# Patient Record
Sex: Male | Born: 1990
Health system: Southern US, Community
[De-identification: ages and names within clinical notes are randomized; demographics above are authoritative.]

## PROBLEM LIST (undated history)

## (undated) DIAGNOSIS — Z8719 Personal history of other diseases of the digestive system: Secondary | ICD-10-CM

## (undated) DIAGNOSIS — E7800 Pure hypercholesterolemia, unspecified: Secondary | ICD-10-CM

## (undated) HISTORY — PX: HERNIA REPAIR: SHX51

## (undated) HISTORY — DX: Personal history of other diseases of the digestive system: Z87.19

---

## 2011-11-02 ENCOUNTER — Encounter (HOSPITAL_COMMUNITY): Payer: Self-pay | Admitting: Emergency Medicine

## 2011-11-02 ENCOUNTER — Emergency Department (HOSPITAL_COMMUNITY): Payer: Worker's Compensation

## 2011-11-02 ENCOUNTER — Emergency Department (HOSPITAL_COMMUNITY)
Admission: EM | Admit: 2011-11-02 | Discharge: 2011-11-02 | Disposition: A | Discharge status: Home / Self Care | Payer: Worker's Compensation | Attending: Emergency Medicine | Admitting: Emergency Medicine

## 2011-11-02 DIAGNOSIS — S6390XA Sprain of unspecified part of unspecified wrist and hand, initial encounter: Secondary | ICD-10-CM | POA: Insufficient documentation

## 2011-11-02 DIAGNOSIS — R209 Unspecified disturbances of skin sensation: Secondary | ICD-10-CM | POA: Insufficient documentation

## 2011-11-02 DIAGNOSIS — IMO0002 Reserved for concepts with insufficient information to code with codable children: Secondary | ICD-10-CM | POA: Insufficient documentation

## 2011-11-02 DIAGNOSIS — S5000XA Contusion of unspecified elbow, initial encounter: Secondary | ICD-10-CM | POA: Insufficient documentation

## 2011-11-02 DIAGNOSIS — M7989 Other specified soft tissue disorders: Secondary | ICD-10-CM | POA: Insufficient documentation

## 2011-11-02 DIAGNOSIS — Y990 Civilian activity done for income or pay: Secondary | ICD-10-CM | POA: Insufficient documentation

## 2011-11-02 DIAGNOSIS — M25569 Pain in unspecified knee: Secondary | ICD-10-CM | POA: Insufficient documentation

## 2011-11-02 DIAGNOSIS — S63619A Unspecified sprain of unspecified finger, initial encounter: Secondary | ICD-10-CM

## 2011-11-02 DIAGNOSIS — M25529 Pain in unspecified elbow: Secondary | ICD-10-CM | POA: Insufficient documentation

## 2011-11-02 DIAGNOSIS — Y9289 Other specified places as the place of occurrence of the external cause: Secondary | ICD-10-CM | POA: Insufficient documentation

## 2011-11-02 MED ORDER — IBUPROFEN 800 MG PO TABS
800.0000 mg | ORAL_TABLET | Freq: Once | ORAL | Status: AC
  Administered 2011-11-02: 800 mg via ORAL
  Filled 2011-11-02: qty 1

## 2011-11-02 MED ORDER — HYDROCODONE-ACETAMINOPHEN 5-325 MG PO TABS
1.0000 | ORAL_TABLET | ORAL | Status: AC | PRN
Start: 2011-11-02 — End: 2011-11-12
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
## 2011-11-02 NOTE — Discharge Instructions (Signed)
Your x-rays today did not show any broken bones, dislocations or other concerning injuries to your hand or elbow. At this time your providers feel you have sprains of your fingers and hand. Breasts your hand and elbow. Use ice to reduce swelling. Take ibuprofen or Aleve for inflammation and pain during the day. Use the prescribed medications for severe pain. Do not drive or operate machinery while taking her prescribed pain medications. Please followup with the primary care provider or orthopedic specialist.   Finger Sprain A finger sprain is a tear in one of the strong, fibrous tissues that connect the bones (ligaments) in your finger. The severity of the sprain depends on how much of the ligament is torn. The tear can be either partial or complete. CAUSES  Often, sprains are a result of a fall or accident. If you extend your hands to catch an object or to protect yourself, the force of the impact causes the fibers of your ligament to stretch too much. This excess tension causes the fibers of your ligament to tear. SYMPTOMS  You may have some loss of motion in your finger. Other symptoms include:  Bruising.   Tenderness.   Swelling.  DIAGNOSIS  In order to diagnose finger sprain, your caregiver will physically examine your finger or thumb to determine how torn the ligament is. Your caregiver may also suggest an X-ray exam of your finger to make sure no bones are broken. TREATMENT  If your ligament is only partially torn, treatment usually involves keeping the finger in a fixed position (immobilization) for a short period. To do this, your caregiver will apply a bandage, cast, or splint to keep your finger from moving until it heals. For a partially torn ligament, the healing process usually takes 2 to 3 weeks. If your ligament is completely torn, you may need surgery to reconnect the ligament to the bone. After surgery a cast or splint will be applied and will need to stay on your finger or thumb  for 4 to 6 weeks while your ligament heals. HOME CARE INSTRUCTIONS  Keep your injured finger elevated, when possible, to decrease swelling.   To ease pain and swelling, apply ice to your joint twice a day, for 2 to 3 days:   Put ice in a plastic bag.   Place a towel between your skin and the bag.   Leave the ice on for 15 minutes.   Only take over-the-counter or prescription medicine for pain as directed by your caregiver.   Do not wear rings on your injured finger.   Do not leave your finger unprotected until pain and stiffness go away (usually 3 to 4 weeks).   Do not allow your cast or splint to get wet. Cover your cast or splint with a plastic bag when you shower or bathe. Do not swim.   Your caregiver may suggest special exercises for you to do during your recovery to prevent or limit permanent stiffness.  SEEK IMMEDIATE MEDICAL CARE IF:  Your cast or splint becomes damaged.   Your pain becomes worse rather than better.  MAKE SURE YOU:  Understand these instructions.   Will watch your condition.   Will get help right away if you are not doing well or get worse.  Document Released: 08/02/2004 Document Revised: 06/14/2011 Document Reviewed: 02/26/2011 [facility] Patient Information 2012 [place], [place].   Elbow Contusion Contusions are areas of tenderness and swelling in the soft tissues. They are the result of damage and bleeding in  the injured area. Minor injuries will give you a painless bruise, but more severe contusions may stay painful and swollen for a few weeks. You have a deep bruise (contusion) of your elbow. SYMPTOMS  A hematoma may form in large contusions. A hematoma is a collection of blood in the deep tissues. Hematomas are usually reabsorbed by the body naturally. Sometimes they need to be drained.  TREATMENT  Only a sling or splint may be needed for a brief period of time or as directed by your caregiver.  HOME CARE INSTRUCTIONS   Only take  over-the-counter or prescription medicines for pain, discomfort, or fever as directed by your caregiver.   Rest the injured elbow until the pain and swelling are better.   Elevate the injury to reduce swelling.   Compression bandages also help reduce swelling and motion.   If you have a splint held on with an elastic wrap or a cast, watch your hand or fingers. If they become numb or cold and blue, loosen the wrap and reapply more loosely. See your caregiver if there is no relief.   You may use ice on your elbow for 20 minutes, 4 times per day, for the first 2 to 3 days.   Use your elbow as directed.   See your caregiver as directed. It is very important to keep all follow-up referrals and appointments in order to avoid any long-term problems with your elbow including chronic pain or inability to move the elbow normally.  SEEK IMMEDIATE MEDICAL CARE IF:   You show signs of infection (increased redness, swelling, or pain).   Swelling or increased pain in your elbow is not relieved with medications.   You have pain or inability to move your fingers or wrist.   You begin to lose feeling in your hand or fingers, or you develop swelling of the hand and fingers.   You get a cold or blue hand or fingers on the injured side.   If your elbow remains sore, your caregiver may want to X-ray it again. A hairline fracture may not show up on the first X-rays and may only be seen on X-rays 10 days to 2 weeks later. A specialist (radiologist) may examine your X-rays at a later time. In order to get results from the X-rays, make sure you know how and when you are to get that information. It is your responsibility to get results of any tests you may have had.   Make sure you have a follow up exam with your caregiver.  MAKE SURE YOU:   Understand these instructions.   Will watch your condition.   Will get help right away if you are not doing well or get worse.  Document Released: 06/03/2006 Document  Revised: 06/14/2011 Document Reviewed: 05/11/2011 [facility] Patient Information 2012 [place], [place].   RESOURCE GUIDE  Dental Problems  Patients with Medicaid: [facility] [phone] [address]                                           [phone] [address] Phone:  [phone]  Phone:  [phone]  If unable to pay or uninsured, contact:  [facility] or [facility]. to become qualified for the adult dental clinic.  Chronic Pain Problems Contact [facility]  [phone] Patients need to be referred by their primary care doctor.  Insufficient Money for Medicine Contact [employer]:  call "211" or [facility] 424-639-8770.  No Primary Care Doctor Call [facility]  [phone] Other agencies that provide inexpensive medical care    [facility]  [phone]    [facility] Internal Medicine  [phone]    [facility]  [phone]    [facility]  [phone]    [facility]  [phone]    [facility] Child Clinic  [phone]  Psychological Services [facility] Behavioral Health  [phone] [facility]  [phone] [facility] Mental Health   [phone] (emergency services [phone])  Substance Abuse Resources [facility]  971-166-4764 [facility] 812-571-8818 [facility] [phone] Floydene Flock 623 622 6228 Residential & Outpatient Substance Abuse Program  [phone]  Abuse/Neglect [facility] Child Abuse Hotline (410)494-3740 [facility] Child Abuse Hotline 475 107 5406 (After Hours)  Emergency Shelter [facility] [phone]  Maternity Homes [facility] [place] [facility] (662)380-1395 [facility] 5305367340  MRSA Hotline #:   734-153-9039    [facility] Resources  [facility] [place]     [employer]                           [facility] Dept. [address] [place]                       [address]      [address]  Blondell Reveal Phone:  [phone]                                   Phone:  [phone]                 Phone:  [phone]  [facility] Mental Health Phone:  [phone]  [facility] Child Abuse Hotline 979-107-5428 5801827977 (After Hours)

## 2011-11-02 NOTE — ED Provider Notes (Signed)
Medical screening examination/treatment/procedure(s) were performed by non-physician practitioner and as supervising physician I was immediately available for consultation/collaboration.   Sunnie Nielsen, MD 11/02/11 [phone]
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
## 2011-11-02 NOTE — ED Notes (Signed)
The pt is requesting finger splints to help keep him from striking them on objects

## 2013-06-01 ENCOUNTER — Encounter (HOSPITAL_COMMUNITY): Payer: Self-pay | Admitting: Emergency Medicine

## 2013-06-01 ENCOUNTER — Emergency Department (HOSPITAL_COMMUNITY)
Admission: EM | Admit: 2013-06-01 | Discharge: 2013-06-01 | Disposition: A | Discharge status: Home / Self Care | Payer: 59 | Attending: Emergency Medicine | Admitting: Emergency Medicine

## 2013-06-01 DIAGNOSIS — M65839 Other synovitis and tenosynovitis, unspecified forearm: Secondary | ICD-10-CM | POA: Insufficient documentation

## 2013-06-01 DIAGNOSIS — F17200 Nicotine dependence, unspecified, uncomplicated: Secondary | ICD-10-CM | POA: Insufficient documentation

## 2013-06-01 DIAGNOSIS — M778 Other enthesopathies, not elsewhere classified: Secondary | ICD-10-CM

## 2013-06-01 DIAGNOSIS — R110 Nausea: Secondary | ICD-10-CM | POA: Insufficient documentation

## 2013-06-01 DIAGNOSIS — Z7982 Long term (current) use of aspirin: Secondary | ICD-10-CM | POA: Insufficient documentation

## 2013-06-01 MED ORDER — HYDROCODONE-ACETAMINOPHEN 5-325 MG PO TABS: 1.0000 | ORAL_TABLET | ORAL | Status: DC | PRN

## 2013-06-01 MED ORDER — TRAMADOL HCL 50 MG PO TABS: 50.0000 mg | ORAL_TABLET | Freq: Four times a day (QID) | ORAL | Status: DC | PRN

## 2013-06-01 MED ORDER — IBUPROFEN 800 MG PO TABS: 800.0000 mg | ORAL_TABLET | Freq: Three times a day (TID) | ORAL | Status: DC | PRN

## 2013-06-01 NOTE — ED Notes (Signed)
Pt states for couple of days has noticed pain and swelling to right wrist without notable injury.  Pt has pain with trying to extend fingers

## 2013-06-01 NOTE — Progress Notes (Signed)
Orthopedic Tech Progress Note Patient Details:  Henry Henry June 06, 1991 [id number]  Ortho Devices Type of Ortho Device: Thumb velcro splint Ortho Device/Splint Location: RUE Ortho Device/Splint Interventions: Ordered;Application   Jennye Moccasin 06/01/2013, 6:41 PM

## 2013-06-01 NOTE — ED Provider Notes (Signed)
CSN: [id number]     Arrival date & time 06/01/13  1657 History   This chart was scribed for Henry Dredge, PA-C, working with Shon Baton, MD by Blanchard Kelch, ED Scribe. This patient was seen in room TR06C/TR06C and the patient's care was started at 6:05 PM.    Chief Complaint  Patient presents with  . Wrist Pain    Patient is a 22 y.o. male presenting with wrist pain. The history is provided by the patient. No language interpreter was used.  Wrist Pain  Wrist Pain Associated symptoms include arthralgias, joint swelling and nausea. Pertinent negatives include no chills, congestion, coughing, fever, myalgias, numbness or weakness.    HPI Comments: Henry Henry is a 22 y.o. male who presents to the Emergency Department complaining of constant pain and associated swelling to his right hand that began three days ago. The pain is worsened with flexion of his right fingers. He describes the feeling as "grinding." He has iced the area and taken ibuprofen without relief of swelling or pain. He placed a brace on the wrist with mild relief of symptoms. He also had associated nausea today secondary to pain. He denies numbness or tingling, weakness, fever, chills, myalgias, cough, congestion, penile pain or discharge, STD treatment, or pain in any other joints. He denies a family or past medical history of gout. He is right handed. He just started a new job at [employer] a week ago, but denies any repetitive use of his wrist or hand at his job, including typing. He denies any recent injury or trauma to the area.    History reviewed. No pertinent past medical history. History reviewed. No pertinent past surgical history. No family history on file. History  Substance Use Topics  . Smoking status: Current Every Day Smoker    Last Attempt to Quit: 10/04/2011  . Smokeless tobacco: Not on file  . Alcohol Use: No    Review of Systems  Constitutional: Negative for fever and chills.  HENT: Negative  for congestion.   Respiratory: Negative for cough.   Gastrointestinal: Positive for nausea.  Genitourinary: Negative for discharge and penile pain.  Musculoskeletal: Positive for arthralgias and joint swelling. Negative for myalgias.  Neurological: Negative for weakness and numbness.    Allergies  Review of patient's allergies indicates no known allergies.  Home Medications   Current Outpatient Rx  Name  Route  Sig  Dispense  Refill  . aspirin (ASPIRIN EC) 81 MG EC tablet   Oral   Take 81 mg by mouth once. Swallow whole.          Triage Vitals: BP 128/88  Pulse 54  Temp(Src) 97.6 F (36.4 C) (Oral)  Resp 18  Wt 214 lb 9 oz (97.325 kg)  SpO2 100%  Physical Exam  Nursing note and vitals reviewed. Constitutional: He appears well-developed and well-nourished. No distress.  HENT:  Head: Normocephalic and atraumatic.  Neck: Neck supple.  Pulmonary/Chest: Effort normal.  Musculoskeletal:       Right wrist: He exhibits bony tenderness and swelling. He exhibits normal range of motion, no crepitus, no deformity and no laceration.       Right forearm: Normal.       Right hand: Normal.  Tenderness over radio-dorsal and radial aspect of right wrist.  Pain greatly increased with Lourena Simmonds test and with flexion of all fingers.  Capillary refill < 3 seconds, sensation intact, radial pulse is intact.   Neurological: He is alert.  Skin: He is not  diaphoretic.    ED Course  Procedures (including critical care time)  DIAGNOSTIC STUDIES: Oxygen Saturation is 100% on room air, normal by my interpretation.    COORDINATION OF CARE: 6:10 PM -Will order wrist brace and recommend specialist to follow up with. Patient verbalizes understanding and agrees with treatment plan.    Labs Review Labs Reviewed - No data to display Imaging Review No results found.  EKG Interpretation   None       MDM   1. Tendonitis of wrist, right    Pt with one week of pain without injury to  right wrist, worse with use of flexor tendons and with Lourena Simmonds test de quervain's tenosynovitis vs flexor tendonitis.  Pt placed in thumb spice velcro splint, d/c home with RICE instructions and NSAIDs, pain medication. Hand surgery follow up.  Discussed  findings, treatment, and follow up  with patient.  Pt given return precautions.  Pt verbalizes understanding and agrees with plan.      I doubt any other EMC precluding discharge at this time including, but not necessarily limited to the following: septic joint, fracture  I personally performed the services described in this documentation, which was scribed in my presence. The recorded information has been reviewed and is accurate.    Henry Dredge, PA-C 06/01/13 2203

## 2013-06-02 NOTE — ED Provider Notes (Signed)
Medical screening examination/treatment/procedure(s) were performed by non-physician practitioner and as supervising physician I was immediately available for consultation/collaboration.  EKG Interpretation   None        Donnalynn Wheeless F Arshia Rondon, MD 06/02/13 1038 

## 2013-09-10 ENCOUNTER — Ambulatory Visit
Admission: RE | Admit: 2013-09-10 | Discharge: 2013-09-10 | Disposition: A | Discharge status: Home / Self Care | Payer: 59 | Source: Ambulatory Visit | Attending: Family Medicine | Admitting: Family Medicine

## 2013-09-10 ENCOUNTER — Other Ambulatory Visit: Payer: Self-pay | Admitting: Family Medicine

## 2013-09-10 DIAGNOSIS — M25572 Pain in left ankle and joints of left foot: Secondary | ICD-10-CM

## 2015-06-07 ENCOUNTER — Encounter (HOSPITAL_COMMUNITY): Payer: Self-pay | Admitting: Emergency Medicine

## 2015-06-07 ENCOUNTER — Emergency Department (HOSPITAL_COMMUNITY): Payer: 59

## 2015-06-07 ENCOUNTER — Emergency Department (HOSPITAL_COMMUNITY)
Admission: EM | Admit: 2015-06-07 | Discharge: 2015-06-07 | Disposition: A | Discharge status: Home / Self Care | Payer: 59 | Attending: Emergency Medicine | Admitting: Emergency Medicine

## 2015-06-07 DIAGNOSIS — K30 Functional dyspepsia: Secondary | ICD-10-CM | POA: Diagnosis not present

## 2015-06-07 DIAGNOSIS — M542 Cervicalgia: Secondary | ICD-10-CM | POA: Diagnosis not present

## 2015-06-07 DIAGNOSIS — F17200 Nicotine dependence, unspecified, uncomplicated: Secondary | ICD-10-CM | POA: Diagnosis not present

## 2015-06-07 DIAGNOSIS — R079 Chest pain, unspecified: Secondary | ICD-10-CM | POA: Diagnosis not present

## 2015-06-07 DIAGNOSIS — Z8639 Personal history of other endocrine, nutritional and metabolic disease: Secondary | ICD-10-CM | POA: Diagnosis not present

## 2015-06-07 HISTORY — DX: Pure hypercholesterolemia, unspecified: E78.00

## 2015-06-07 LAB — CBC
HEMATOCRIT: 45.6 % (ref 39.0–52.0)
HEMOGLOBIN: 15.7 g/dL (ref 13.0–17.0)
MCH: 28.6 pg (ref 26.0–34.0)
MCHC: 34.4 g/dL (ref 30.0–36.0)
MCV: 83.2 fL (ref 78.0–100.0)
Platelets: 258 10*3/uL (ref 150–400)
RBC: 5.48 MIL/uL (ref 4.22–5.81)
RDW: 13.1 % (ref 11.5–15.5)
WBC: 9.8 10*3/uL (ref 4.0–10.5)

## 2015-06-07 LAB — BASIC METABOLIC PANEL
ANION GAP: 8 (ref 5–15)
BUN: 12 mg/dL (ref 6–20)
CHLORIDE: 105 mmol/L (ref 101–111)
CO2: 28 mmol/L (ref 22–32)
Calcium: 9.9 mg/dL (ref 8.9–10.3)
Creatinine, Ser: 1.11 mg/dL (ref 0.61–1.24)
GFR calc Af Amer: >60 mL/min (ref >60)
GFR calc non Af Amer: >60 mL/min (ref >60)
Glucose, Bld: 99 mg/dL (ref 65–99)
POTASSIUM: 3.9 mmol/L (ref 3.5–5.1)
SODIUM: 141 mmol/L (ref 135–145)

## 2015-06-07 LAB — I-STAT TROPONIN, ED: Troponin i, poc: 0 ng/mL (ref 0.00–0.08)

## 2015-06-07 MED ORDER — RANITIDINE HCL 150 MG PO TABS: 150.0000 mg | ORAL_TABLET | Freq: Two times a day (BID) | ORAL | Status: DC

## 2015-06-07 MED ORDER — PANTOPRAZOLE SODIUM 20 MG PO TBEC: 20.0000 mg | DELAYED_RELEASE_TABLET | Freq: Every day | ORAL | Status: DC

## 2015-06-07 NOTE — ED Provider Notes (Signed)
CSN: [id number]     Arrival date & time 06/07/15  1621 History   First MD Initiated Contact with Patient 06/07/15 2226     Chief Complaint  Patient presents with  . Chest Pain  . Neck Pain    Patient is a 24 y.o. male presenting with chest pain. The history is provided by the patient.  Chest Pain Pain location:  Substernal area Pain quality: burning   Radiates to: mid chest. Pain severity:  Moderate Onset quality:  Gradual Duration:  1 week Timing:  Intermittent Progression:  Waxing and waning Chronicity:  New Context: eating   Relieved by:  Nothing Associated symptoms: no abdominal pain, no back pain, no cough, no fever, no headache, no nausea, no shortness of breath and not vomiting   Associated symptoms comment:  Neck pain Risk factors: no coronary artery disease     Past Medical History  Diagnosis Date  . Hypercholesteremia    History reviewed. No pertinent past surgical history. History reviewed. No pertinent family history. Social History  Substance Use Topics  . Smoking status: Current Every Day Smoker    Last Attempt to Quit: 10/04/2011  . Smokeless tobacco: None  . Alcohol Use: No    Review of Systems  Constitutional: Negative for fever and chills.  HENT: Negative for rhinorrhea and sore throat.   Eyes: Negative for visual disturbance.  Respiratory: Negative for cough and shortness of breath.   Cardiovascular: Positive for chest pain.  Gastrointestinal: Negative for nausea, vomiting, abdominal pain, diarrhea and constipation.  Genitourinary: Negative for dysuria and hematuria.  Musculoskeletal: Positive for neck pain. Negative for back pain.  Skin: Negative for rash.  Neurological: Negative for syncope and headaches.  Psychiatric/Behavioral: Negative for confusion.  All other systems reviewed and are negative.  Allergies  Review of patient's allergies indicates no known allergies.  Home Medications   Prior to Admission medications   Medication Sig  Start Date End Date Taking? Authorizing Provider  pantoprazole (PROTONIX) 20 MG tablet Take 1 tablet (20 mg total) by mouth daily. 06/07/15   Maris Berger, MD  ranitidine (ZANTAC) 150 MG tablet Take 1 tablet (150 mg total) by mouth 2 (two) times daily. 06/07/15   Maris Berger, MD   BP 116/74 mmHg  Pulse 93  Temp(Src) 98.9 F (37.2 C) (Oral)  Resp 18  SpO2 98% Physical Exam  Constitutional: He is oriented to person, place, and time. He appears well-developed and well-nourished. No distress.  HENT:  Head: Normocephalic and atraumatic.  Mouth/Throat: Oropharynx is clear and moist. No oropharyngeal exudate, posterior oropharyngeal edema or posterior oropharyngeal erythema.  Eyes: EOM are normal.  Neck: Full passive range of motion without pain. Neck supple. No JVD present. Muscular tenderness (left SCM near sternal end) present. No spinous process tenderness present. Carotid bruit is not present. No rigidity. Normal range of motion present. No thyromegaly present.  Cardiovascular: Normal rate, regular rhythm, normal heart sounds and intact distal pulses.   Pulmonary/Chest: Effort normal and breath sounds normal.  Abdominal: Soft. He exhibits no distension. There is tenderness (mild) in the epigastric area.  Musculoskeletal: Normal range of motion. He exhibits no edema.  Lymphadenopathy:    He has no cervical adenopathy.  Neurological: He is alert and oriented to person, place, and time. No cranial nerve deficit.  Skin: Skin is warm and dry.  Psychiatric: His behavior is normal.    ED Course  Procedures  None   Labs Review Labs Reviewed  BASIC METABOLIC PANEL  CBC  Rosezena SensorI-STAT TROPOININ, ED    Imaging Review Dg Chest 2 View  06/07/2015  CLINICAL DATA:  Shortness of breath and chest pain for several days EXAM: CHEST  2 VIEW COMPARISON:  May 13, 2011 FINDINGS: The lungs are clear. The heart size and pulmonary vascularity are normal. No adenopathy. No pneumothorax. No bone lesions.  IMPRESSION: No edema or consolidation. Electronically Signed   By: Bretta BangWilliam  Woodruff III M.D.   On: 06/07/2015 17:11   I have personally reviewed and evaluated these images and lab results as part of my medical decision-making.  MDM   Final diagnoses:  Indigestion  Neck pain on left side   24 yo M with a PMH of hypercholesterolemia and tobacco abuse who presents with chest pain and left sided neck pain. Patient reportedly has GERD and was treated in the past, currently unmedicated. Pain is burning and travels from epigastrium to midsternum. Left neck pain is reproducible to palpation of SCM near sternal attachment. No LAD, no stridor, otherwise normal HENT exam. Suspect MSK. Recommended NSAIDs for pain. CXR unremarkable. EKG with no ST elevation or depression, NSR, rate 94, intervals maintained, normal axis, no BBB, no wellens, no brugada, no WPW. Nonspecific T wave changes in inferior leads. No prior EKG to compare. Doubt ACS or PE. Will begin trial of H2 blockers and PPI for symptoms. Smoking cessation counseling provided. I provided verbal discharge instructions including follow up and return precautions. Patient agreeable with plan.   Discussed with Dr. Denton LankSteinl.  Maris BergerJonah Tywanna Seifer, MD 06/08/15 [id number]  Cathren LaineKevin Steinl, MD 06/08/15 [phone]

## 2015-06-07 NOTE — ED Notes (Signed)
MD at bedside. 

## 2015-06-07 NOTE — ED Notes (Signed)
Pt sts neck pain x 3 days with mid sternal CP and some SOB

## 2017-11-15 ENCOUNTER — Ambulatory Visit (INDEPENDENT_AMBULATORY_CARE_PROVIDER_SITE_OTHER): Payer: Worker's Compensation | Admitting: Family Medicine

## 2017-11-15 ENCOUNTER — Encounter: Payer: Self-pay | Admitting: Family Medicine

## 2017-11-15 VITALS — BP 119/78 | HR 73 | Temp 98.4°F | Ht 68.0 in | Wt 223.2 lb

## 2017-11-15 DIAGNOSIS — S60931A Unspecified superficial injury of right thumb, initial encounter: Secondary | ICD-10-CM | POA: Diagnosis not present

## 2017-11-15 DIAGNOSIS — W5911XA Bitten by nonvenomous snake, initial encounter: Secondary | ICD-10-CM | POA: Diagnosis not present

## 2017-11-15 NOTE — Progress Notes (Signed)
   HPI  Patient presents today for possible snakebite.  Workers Comp case  Patient was cleaning up brush today around 2:00 for the city of [place] whenever he felt a bump on his right thumb and looked down and saw a snake.  A colleague used a shovel to cut the head off of the snake and he brought in today.  It appears to be a baby copperhead snake.  He states that he feels slight swelling in the right hand. He otherwise feels normal  PMH: Smoking status noted ROS: Per HPI  Objective: BP 119/78   Pulse 73   Temp 98.4 F (36.9 C) (Oral)   Ht  (1.727 m)   Wt 223 lb 3.2 oz (101.2 kg)   BMI 33.94 kg/m  Gen: NAD, alert, cooperative with exam HEENT: NCAT CV: RRR, good S1/S2, no murmur Resp: CTABL, no wheezes, non-labored Ext: No edema, warm Neuro: Alert and oriented, No gross deficits Skin:   Right thumb with extremely small lesion at the site of concern.  1 mm x 3 mm with 2 distinct punctate lesions that could be very small fang bites.       Assessment and plan:  #Snakebite Patient with possible snakebite, it is very unclear whether or not this broke the skin, however with the patient sensing hand swelling and the seriousness of copperhead  venom I think it is most prudent to go ahead and go to the emergency room for monitoring.   Murtis Sink, MD [facility] 11/15/2017, 3:16 PM

## 2018-03-14 ENCOUNTER — Encounter (HOSPITAL_COMMUNITY): Payer: Self-pay | Admitting: Emergency Medicine

## 2018-03-14 ENCOUNTER — Emergency Department (HOSPITAL_COMMUNITY): Payer: BLUE CROSS/BLUE SHIELD

## 2018-03-14 ENCOUNTER — Emergency Department (HOSPITAL_COMMUNITY)
Admission: EM | Admit: 2018-03-14 | Discharge: 2018-03-14 | Disposition: A | Discharge status: Home / Self Care | Payer: BLUE CROSS/BLUE SHIELD | Attending: Emergency Medicine | Admitting: Emergency Medicine

## 2018-03-14 DIAGNOSIS — R0602 Shortness of breath: Secondary | ICD-10-CM | POA: Insufficient documentation

## 2018-03-14 DIAGNOSIS — R61 Generalized hyperhidrosis: Secondary | ICD-10-CM | POA: Diagnosis not present

## 2018-03-14 DIAGNOSIS — R42 Dizziness and giddiness: Secondary | ICD-10-CM | POA: Diagnosis not present

## 2018-03-14 DIAGNOSIS — Z87891 Personal history of nicotine dependence: Secondary | ICD-10-CM | POA: Insufficient documentation

## 2018-03-14 DIAGNOSIS — R110 Nausea: Secondary | ICD-10-CM | POA: Insufficient documentation

## 2018-03-14 DIAGNOSIS — R0789 Other chest pain: Secondary | ICD-10-CM | POA: Diagnosis not present

## 2018-03-14 DIAGNOSIS — R079 Chest pain, unspecified: Secondary | ICD-10-CM | POA: Diagnosis not present

## 2018-03-14 LAB — COMPREHENSIVE METABOLIC PANEL
ALT: 39 U/L (ref 0–44)
AST: 33 U/L (ref 15–41)
Albumin: 4.2 g/dL (ref 3.5–5.0)
Alkaline Phosphatase: 83 U/L (ref 38–126)
Anion gap: 11 (ref 5–15)
BUN: 16 mg/dL (ref 6–20)
CHLORIDE: 110 mmol/L (ref 98–111)
CO2: 24 mmol/L (ref 22–32)
CREATININE: 1.02 mg/dL (ref 0.61–1.24)
Calcium: 10.2 mg/dL (ref 8.9–10.3)
GFR calc non Af Amer: >60 mL/min (ref >60)
Glucose, Bld: 111 mg/dL — ABNORMAL HIGH (ref 70–99)
Potassium: 3.8 mmol/L (ref 3.5–5.1)
SODIUM: 145 mmol/L (ref 135–145)
Total Bilirubin: 1 mg/dL (ref 0.3–1.2)
Total Protein: 7.8 g/dL (ref 6.5–8.1)

## 2018-03-14 LAB — D-DIMER, QUANTITATIVE (NOT AT ARMC): D DIMER QUANT: 0.34 ug{FEU}/mL (ref 0.00–0.50)

## 2018-03-14 LAB — CBC WITH DIFFERENTIAL/PLATELET
Basophils Absolute: 0 10*3/uL (ref 0.0–0.1)
Basophils Relative: 0 %
EOS ABS: 0.1 10*3/uL (ref 0.0–0.7)
Eosinophils Relative: 1 %
HCT: 43.9 % (ref 39.0–52.0)
Hemoglobin: 15.4 g/dL (ref 13.0–17.0)
LYMPHS ABS: 1.6 10*3/uL (ref 0.7–4.0)
LYMPHS PCT: 20 %
MCH: 28.7 pg (ref 26.0–34.0)
MCHC: 35.1 g/dL (ref 30.0–36.0)
MCV: 81.8 fL (ref 78.0–100.0)
Monocytes Absolute: 0.5 10*3/uL (ref 0.1–1.0)
Monocytes Relative: 7 %
NEUTROS PCT: 72 %
Neutro Abs: 5.8 10*3/uL (ref 1.7–7.7)
PLATELETS: 269 10*3/uL (ref 150–400)
RBC: 5.37 MIL/uL (ref 4.22–5.81)
RDW: 13.1 % (ref 11.5–15.5)
WBC: 8 10*3/uL (ref 4.0–10.5)

## 2018-03-14 LAB — TROPONIN I: Troponin I: <0.03 ng/mL (ref <0.03)

## 2018-03-14 LAB — LIPASE, BLOOD: Lipase: 37 U/L (ref 11–51)

## 2018-03-14 LAB — I-STAT TROPONIN, ED
TROPONIN I, POC: 0 ng/mL (ref 0.00–0.08)
Troponin i, poc: 0.02 ng/mL (ref 0.00–0.08)

## 2018-03-14 MED ORDER — SODIUM CHLORIDE 0.9 % IV BOLUS
1000.0000 mL | Freq: Once | INTRAVENOUS | Status: AC
  Administered 2018-03-14: 1000 mL via INTRAVENOUS

## 2018-03-14 MED ORDER — LORAZEPAM 2 MG/ML IJ SOLN
0.5000 mg | Freq: Once | INTRAMUSCULAR | Status: AC
  Administered 2018-03-14: 0.5 mg via INTRAVENOUS
  Filled 2018-03-14: qty 1

## 2018-03-14 MED ORDER — MECLIZINE HCL 25 MG PO TABS: 25.0000 mg | ORAL_TABLET | Freq: Three times a day (TID) | ORAL | 0 refills | Status: DC | PRN

## 2018-03-14 MED ORDER — ONDANSETRON 4 MG PO TBDP
4.0000 mg | ORAL_TABLET | Freq: Once | ORAL | Status: AC
  Administered 2018-03-14: 4 mg via ORAL
  Filled 2018-03-14: qty 1

## 2018-03-14 MED ORDER — ONDANSETRON 4 MG PO TBDP: ORAL_TABLET | ORAL | 0 refills | Status: DC

## 2018-03-14 MED ORDER — MECLIZINE HCL 25 MG PO TABS
25.0000 mg | ORAL_TABLET | Freq: Once | ORAL | Status: AC
  Administered 2018-03-14: 25 mg via ORAL
  Filled 2018-03-14: qty 1

## 2018-03-14 MED ORDER — GI COCKTAIL ~~LOC~~
30.0000 mL | Freq: Once | ORAL | Status: AC
  Administered 2018-03-14: 30 mL via ORAL
  Filled 2018-03-14: qty 30

## 2018-03-14 NOTE — ED Notes (Signed)
Patient ambulated to bathroom. Patient tolerated well, vital signs stable, and denies chest pain.

## 2018-03-14 NOTE — ED Provider Notes (Signed)
[facility]-EMERGENCY DEPT Provider Note   CSN: [id number] Arrival date & time: 03/14/18  4098     History   Chief Complaint Chief Complaint  Patient presents with  . Shortness of Breath  . Dizziness  . Chest Pain    HPI Henry Henry is a 27 y.o. male.  HPI Patient states he woke up early this morning around 2 AM with room spinning sensation, nausea and diaphoresis.  States that symptoms improved and he went back to sleep.  He then woke up later this morning with central chest pressure and shortness of breath.  Still complains of lightheadedness but no spinning sensation.  Still had mild nausea but no vomiting.  States he went to work and symptoms persisted.  Rate was in the 140s by the monitor on his watch.  EMS called and patient had a normal twelve-lead EKG.  Patient continues to have central chest tightness which does not radiate.  Also complains of shortness of breath without cough.  No new lower extremity swelling or pain.  No recent immobilization or extended travel.  Patient states he his father did have quadruple bypass in his 62s.  Denies drug or alcohol use.  No recent NSAID use.  Patient had some ringing in his right ear several days ago which has resolved. Past Medical History:  Diagnosis Date  . Hypercholesteremia     There are no active problems to display for this patient.   History reviewed. No pertinent surgical history.      Home Medications    Prior to Admission medications   Medication Sig Start Date End Date Taking? Authorizing Provider  meclizine (ANTIVERT) 25 MG tablet Take 1 tablet (25 mg total) by mouth 3 (three) times daily as needed for dizziness. 03/14/18   Loren Racer, MD  ondansetron (ZOFRAN ODT) 4 MG disintegrating tablet 4mg  ODT q4 hours prn nausea/vomit 03/14/18   Loren Racer, MD    Family History No family history on file.  Social History Social History   Tobacco Use  . Smoking status: Former Smoker   Last attempt to quit: 10/04/2011    Years since quitting: 6.4  . Smokeless tobacco: Never Used  Substance Use Topics  . Alcohol use: No  . Drug use: No     Allergies   Patient has no known allergies.   Review of Systems Review of Systems  Constitutional: Positive for diaphoresis. Negative for chills and fever.  HENT: Positive for tinnitus. Negative for congestion, rhinorrhea, sinus pressure and sinus pain.   Eyes: Negative for visual disturbance.  Respiratory: Positive for chest tightness and shortness of breath. Negative for cough.   Cardiovascular: Positive for chest pain and palpitations.  Gastrointestinal: Positive for abdominal pain and nausea. Negative for constipation, diarrhea and vomiting.  Genitourinary: Negative for dysuria, flank pain and frequency.  Musculoskeletal: Negative for back pain, joint swelling, myalgias and neck pain.  Skin: Negative for rash and wound.  Neurological: Positive for dizziness and light-headedness. Negative for syncope, weakness, numbness and headaches.  Psychiatric/Behavioral: The patient is nervous/anxious.   All other systems reviewed and are negative.    Physical Exam Updated Vital Signs BP 116/69   Pulse (!) 54   Temp 97.6 F (36.4 C) (Oral)   Resp 19   Wt 97.5 kg   SpO2 98%   BMI 32.69 kg/m   Physical Exam  Constitutional: He is oriented to person, place, and time. He appears well-developed and well-nourished. No distress.  HENT:  Head: Normocephalic  and atraumatic.  Mouth/Throat: Oropharynx is clear and moist. No oropharyngeal exudate.  Mild bulging of the right TM.  Eyes: Pupils are equal, round, and reactive to light. EOM are normal.  Fatigable horizontal nystagmus with fast component to the right  Neck: Normal range of motion. Neck supple. No JVD present.  Cardiovascular: Normal rate and regular rhythm. Exam reveals no gallop and no friction rub.  No murmur heard. Pulmonary/Chest: Effort normal and breath sounds  normal. No stridor. No respiratory distress. He has no wheezes. He has no rales. He exhibits no tenderness.  Abdominal: Soft. Bowel sounds are normal. There is tenderness. There is no rebound and no guarding.  Epigastric tenderness to palpation.  Musculoskeletal: Normal range of motion. He exhibits no edema or tenderness.  No midline thoracic or lumbar tenderness.  No CVA tenderness.  No definite lower extremity swelling, asymmetry or tenderness.  Lymphadenopathy:    He has no cervical adenopathy.  Neurological: He is alert and oriented to person, place, and time.  Patient is alert and oriented x3 with clear, goal oriented speech. Patient has 5/5 motor in all extremities. Sensation is intact to light touch. Bilateral finger-to-nose is normal with no signs of dysmetria.   Skin: Skin is warm and dry. Capillary refill takes less than 2 seconds. No rash noted. He is not diaphoretic. No erythema.  Psychiatric: He has a normal mood and affect. His behavior is normal.  Nursing note and vitals reviewed.    ED Treatments / Results  Labs (all labs ordered are listed, but only abnormal results are displayed) Labs Reviewed  COMPREHENSIVE METABOLIC PANEL - Abnormal; Notable for the following components:      Result Value   Glucose, Bld 111 (*)    All other components within normal limits  CBC WITH DIFFERENTIAL/PLATELET  TROPONIN I  D-DIMER, QUANTITATIVE (NOT AT [facility])  LIPASE, BLOOD  I-STAT TROPONIN, ED  I-STAT TROPONIN, ED    EKG EKG Interpretation  Date/Time:  Friday March 14 2018 08:50:58 EDT Ventricular Rate:  58 PR Interval:    QRS Duration: 100 QT Interval:  374 QTC Calculation: 368 R Axis:   29 Text Interpretation:  Sinus rhythm Confirmed by Loren Racer ([id number]) on 03/14/2018 9:14:01 AM   Radiology Dg Chest 2 View  Result Date: 03/14/2018 CLINICAL DATA:  Shortness of breath and chest pain EXAM: CHEST - 2 VIEW COMPARISON:  June 07, 2015 FINDINGS: Lungs are clear. Heart  is borderline enlarged with pulmonary vascularity normal. No adenopathy. No bone lesions. No pneumothorax. IMPRESSION: Borderline cardiac enlargement.  No edema or consolidation. Electronically Signed   By: Bretta Bang III M.D.   On: 03/14/2018 09:39    Procedures Procedures (including critical care time)  Medications Ordered in ED Medications  gi cocktail (Maalox,Lidocaine,Donnatal) (30 mLs Oral Given 03/14/18 0958)  meclizine (ANTIVERT) tablet 25 mg (25 mg Oral Given 03/14/18 0959)  ondansetron (ZOFRAN-ODT) disintegrating tablet 4 mg (4 mg Oral Given 03/14/18 1001)  sodium chloride 0.9 % bolus 1,000 mL (0 mLs Intravenous Stopped 03/14/18 1245)  LORazepam (ATIVAN) injection 0.5 mg (0.5 mg Intravenous Given 03/14/18 1126)  sodium chloride 0.9 % bolus 1,000 mL (1,000 mLs Intravenous New Bag/Given 03/14/18 1245)     Initial Impression / Assessment and Plan / ED Course  I have reviewed the triage vital signs and the nursing notes.  Pertinent labs & imaging results that were available during my care of the patient were reviewed by me and considered in my medical decision making (see chart  for details).     Troponin x2 is normal.  EKG without ischemic findings.  Laboratory work-up is normal including normal d-dimer.  Appears to have a vertiginous component to his dizziness as well as some lightheadedness.  Nausea and spinning sensation is resolved after meclizine.  Improved lightheadedness after IV fluids.  Patient is able to ambulate without difficulty.  Think he is medically cleared to be discharged.  Encouraged him to follow-up with cardiology if his symptoms persist.  Worsening symptoms, come back to the emergency department for reevaluation.  Final Clinical Impressions(s) / ED Diagnoses   Final diagnoses:  Dizziness  Atypical chest pain    ED Discharge Orders         Ordered    meclizine (ANTIVERT) 25 MG tablet  3 times daily PRN     03/14/18 1427    ondansetron (ZOFRAN ODT) 4 MG  disintegrating tablet     03/14/18 1427           Loren Racer, MD 03/17/18 2313

## 2018-03-14 NOTE — ED Notes (Signed)
Patient given discharge teaching and verbalized understanding. Patient ambulated out of ED with a steady gait. 

## 2018-03-14 NOTE — ED Triage Notes (Addendum)
Pt c/o shortness of breath, dizziness and chest pain since early this morning. Pt states his heart rate was in the 140's this morning, now 58-70 in triage.

## 2018-03-14 NOTE — ED Notes (Signed)
Pt ambulated to restroom. 

## 2018-07-25 DIAGNOSIS — Z3009 Encounter for other general counseling and advice on contraception: Secondary | ICD-10-CM | POA: Diagnosis not present

## 2018-07-25 DIAGNOSIS — Z720 Tobacco use: Secondary | ICD-10-CM | POA: Diagnosis not present

## 2018-09-15 DIAGNOSIS — Z3009 Encounter for other general counseling and advice on contraception: Secondary | ICD-10-CM | POA: Diagnosis not present

## 2018-10-10 DIAGNOSIS — Z302 Encounter for sterilization: Secondary | ICD-10-CM | POA: Diagnosis not present

## 2018-11-27 DIAGNOSIS — F411 Generalized anxiety disorder: Secondary | ICD-10-CM | POA: Diagnosis not present

## 2018-12-04 DIAGNOSIS — F411 Generalized anxiety disorder: Secondary | ICD-10-CM | POA: Diagnosis not present

## 2018-12-09 DIAGNOSIS — M545 Low back pain: Secondary | ICD-10-CM | POA: Diagnosis not present

## 2018-12-11 DIAGNOSIS — F411 Generalized anxiety disorder: Secondary | ICD-10-CM | POA: Diagnosis not present

## 2018-12-18 DIAGNOSIS — F411 Generalized anxiety disorder: Secondary | ICD-10-CM | POA: Diagnosis not present

## 2019-04-22 ENCOUNTER — Other Ambulatory Visit: Payer: Self-pay | Admitting: Physician Assistant

## 2019-04-22 DIAGNOSIS — R42 Dizziness and giddiness: Secondary | ICD-10-CM | POA: Diagnosis not present

## 2019-04-22 DIAGNOSIS — M7989 Other specified soft tissue disorders: Secondary | ICD-10-CM

## 2019-04-22 DIAGNOSIS — E785 Hyperlipidemia, unspecified: Secondary | ICD-10-CM | POA: Diagnosis not present

## 2019-04-29 ENCOUNTER — Ambulatory Visit (INDEPENDENT_AMBULATORY_CARE_PROVIDER_SITE_OTHER): Payer: BC Managed Care – PPO

## 2019-04-29 ENCOUNTER — Other Ambulatory Visit: Payer: Self-pay

## 2019-04-29 DIAGNOSIS — R1903 Right lower quadrant abdominal swelling, mass and lump: Secondary | ICD-10-CM | POA: Diagnosis not present

## 2019-04-29 DIAGNOSIS — M7989 Other specified soft tissue disorders: Secondary | ICD-10-CM | POA: Diagnosis not present

## 2019-06-12 DIAGNOSIS — Z20828 Contact with and (suspected) exposure to other viral communicable diseases: Secondary | ICD-10-CM | POA: Diagnosis not present

## 2019-07-10 HISTORY — PX: SHOULDER SURGERY: SHX246

## 2019-07-29 DIAGNOSIS — K4090 Unilateral inguinal hernia, without obstruction or gangrene, not specified as recurrent: Secondary | ICD-10-CM | POA: Diagnosis not present

## 2019-08-05 DIAGNOSIS — K4090 Unilateral inguinal hernia, without obstruction or gangrene, not specified as recurrent: Secondary | ICD-10-CM | POA: Diagnosis not present

## 2019-08-31 DIAGNOSIS — Z01818 Encounter for other preprocedural examination: Secondary | ICD-10-CM | POA: Diagnosis not present

## 2019-09-03 DIAGNOSIS — K219 Gastro-esophageal reflux disease without esophagitis: Secondary | ICD-10-CM | POA: Diagnosis not present

## 2019-09-03 DIAGNOSIS — Z87891 Personal history of nicotine dependence: Secondary | ICD-10-CM | POA: Diagnosis not present

## 2019-09-03 DIAGNOSIS — K4090 Unilateral inguinal hernia, without obstruction or gangrene, not specified as recurrent: Secondary | ICD-10-CM | POA: Diagnosis not present

## 2019-10-26 DIAGNOSIS — R0789 Other chest pain: Secondary | ICD-10-CM | POA: Diagnosis not present

## 2019-12-08 DIAGNOSIS — Z8719 Personal history of other diseases of the digestive system: Secondary | ICD-10-CM | POA: Diagnosis not present

## 2019-12-08 DIAGNOSIS — Z9889 Other specified postprocedural states: Secondary | ICD-10-CM | POA: Diagnosis not present

## 2019-12-08 DIAGNOSIS — M79604 Pain in right leg: Secondary | ICD-10-CM | POA: Diagnosis not present

## 2019-12-08 DIAGNOSIS — R1031 Right lower quadrant pain: Secondary | ICD-10-CM | POA: Diagnosis not present

## 2020-02-29 DIAGNOSIS — L255 Unspecified contact dermatitis due to plants, except food: Secondary | ICD-10-CM | POA: Diagnosis not present

## 2020-07-13 DIAGNOSIS — M25512 Pain in left shoulder: Secondary | ICD-10-CM | POA: Diagnosis not present

## 2020-08-03 DIAGNOSIS — M25519 Pain in unspecified shoulder: Secondary | ICD-10-CM | POA: Diagnosis not present

## 2020-08-04 DIAGNOSIS — M25512 Pain in left shoulder: Secondary | ICD-10-CM | POA: Diagnosis not present

## 2020-08-04 DIAGNOSIS — M25511 Pain in right shoulder: Secondary | ICD-10-CM | POA: Diagnosis not present

## 2020-08-23 DIAGNOSIS — M25512 Pain in left shoulder: Secondary | ICD-10-CM | POA: Diagnosis not present

## 2020-08-29 ENCOUNTER — Ambulatory Visit: Payer: BC Managed Care – PPO | Admitting: Physical Therapy

## 2020-09-06 DIAGNOSIS — M24212 Disorder of ligament, left shoulder: Secondary | ICD-10-CM | POA: Diagnosis not present

## 2020-09-06 DIAGNOSIS — M67812 Other specified disorders of synovium, left shoulder: Secondary | ICD-10-CM | POA: Diagnosis not present

## 2020-09-06 DIAGNOSIS — M25512 Pain in left shoulder: Secondary | ICD-10-CM | POA: Diagnosis not present

## 2020-09-26 DIAGNOSIS — Y999 Unspecified external cause status: Secondary | ICD-10-CM | POA: Diagnosis not present

## 2020-09-26 DIAGNOSIS — M25312 Other instability, left shoulder: Secondary | ICD-10-CM | POA: Diagnosis not present

## 2020-09-26 DIAGNOSIS — S43492A Other sprain of left shoulder joint, initial encounter: Secondary | ICD-10-CM | POA: Diagnosis not present

## 2020-09-26 DIAGNOSIS — G8918 Other acute postprocedural pain: Secondary | ICD-10-CM | POA: Diagnosis not present

## 2020-09-26 DIAGNOSIS — M19012 Primary osteoarthritis, left shoulder: Secondary | ICD-10-CM | POA: Diagnosis not present

## 2020-09-26 DIAGNOSIS — X58XXXA Exposure to other specified factors, initial encounter: Secondary | ICD-10-CM | POA: Diagnosis not present

## 2020-09-26 DIAGNOSIS — M94212 Chondromalacia, left shoulder: Secondary | ICD-10-CM | POA: Diagnosis not present

## 2020-09-26 DIAGNOSIS — S43432A Superior glenoid labrum lesion of left shoulder, initial encounter: Secondary | ICD-10-CM | POA: Diagnosis not present

## 2020-09-26 DIAGNOSIS — M24112 Other articular cartilage disorders, left shoulder: Secondary | ICD-10-CM | POA: Diagnosis not present

## 2020-10-10 ENCOUNTER — Other Ambulatory Visit: Payer: Self-pay

## 2020-10-10 ENCOUNTER — Ambulatory Visit: Payer: BC Managed Care – PPO | Attending: Orthopedic Surgery | Admitting: Physical Therapy

## 2020-10-10 ENCOUNTER — Encounter: Payer: Self-pay | Admitting: Physical Therapy

## 2020-10-10 DIAGNOSIS — M25512 Pain in left shoulder: Secondary | ICD-10-CM | POA: Insufficient documentation

## 2020-10-10 DIAGNOSIS — M25612 Stiffness of left shoulder, not elsewhere classified: Secondary | ICD-10-CM | POA: Diagnosis not present

## 2020-10-10 NOTE — Therapy (Signed)
[facility] [address] [place], [place], [address] Phone: [phone]   Fax:  [phone]  Physical Therapy Evaluation  Patient Details  Name: Henry Henry MRN: [id number] Date of Birth: Aug 16, 1990 Referring Provider (PT): Duwayne Heck MD   Encounter Date: 10/10/2020   PT End of Session - 10/10/20 1500    Visit Number 1    Number of Visits 8    Date for PT Re-Evaluation 11/07/20    PT Start Time 0100    PT Stop Time 0136    PT Time Calculation (min) 36 min    Activity Tolerance Patient tolerated treatment well    Behavior During Therapy [facility] for tasks assessed/performed           Past Medical History:  Diagnosis Date  . H/O hernia repair   . Hypercholesteremia     History reviewed. No pertinent surgical history.  There were no vitals filed for this visit.    Subjective Assessment - 10/10/20 1437    Subjective COVID-19 screen performed prior to patient entering clinic.  The patient presents to the clinic today s/p left shoulder arthroscopic capsulorrhaphy performed on 09/26/20.  His states this was due to a weight lifting injury at the gym on 08/02/20.  He states he is now at the of the sling most of the time and is reporting no pain today.  He did wake up with some play after lying on his left shoulder put this resolved.    Pertinent History Hernia repair.    Patient Stated Goals Get back to work for the city of [place].    Currently in Pain? No/denies              [facility] PT Assessment - 10/10/20 0001      Assessment   Medical Diagnosis Left shoulder arthroscopic capsulorrhapy.    Referring Provider (PT) Duwayne Heck MD    Onset Date/Surgical Date --   09/26/20 (surgery date).     Precautions   Precaution Comments PER PROTOCOL.      Restrictions   Weight Bearing Restrictions --   No left UE weight bearing.     Balance Screen   Has the patient fallen in the past 6 months No    Has the patient had a decrease  in activity level because of a fear of falling?  No    Is the patient reluctant to leave their home because of a fear of falling?  No      Home [employer]ist information centre manager residence      Prior Function   Level of Independence Independent      ROM / Strength   AROM / PROM / Strength AROM      AROM   Overall AROM Comments In supine:  Gentle right shoulder PAROM into left shoulder flexion is 95 degrees and ER is 35 degrees.      Palpation   Palpation comment Minimal tenderness around left shoulder scope sites.                      Objective measurements completed on examination: See above findings.               PT Education - 10/10/20 1506    Person(s) Educated Patient    Methods Explanation;Demonstration;Handout    Comprehension Verbalized understanding;Returned demonstration               PT Long Term Goals -  10/10/20 1508      PT LONG TERM GOAL #1   Title Independent with a HEP.    Time 4    Period Weeks    Status New      PT LONG TERM GOAL #2   Title Active shoulder flexion to 160 degrees+ so the patient can easily reach overhead.    Time 4    Period Weeks    Status New      PT LONG TERM GOAL #3   Title Active ER to 80 degrees+ to allow for easily donning/doffing of apparel.    Time 4    Period Weeks    Status New      PT LONG TERM GOAL #4   Title Increase left shoulder strength to a solid 5/5 increase stability for performance of functional activities.    Time 4    Period Weeks    Status New      PT LONG TERM GOAL #5   Title Perform ADL's with pain not > 2/10.    Time 4    Period Weeks    Status New                  Plan - 10/10/20 1501    Clinical Impression Statement The patient presents to OPPT s/p left shoulder arthroscopic capsulorrhaphy performed on 09/26/20.  He reports no pain today.  His left shoulder range of motion is restricted as expected.  We discussed following a protocol and he  understood.  Patient is also motivated to do as much at home as he has a very high co-pay.  Patient will benefit from skilled physical therapy intervention to address pain and deficits.    Personal Factors and Comorbidities Comorbidity 1    Comorbidities Hernia repair.    Examination-Activity Limitations Reach Overhead;Other    Examination-Participation Restrictions Other    Stability/Clinical Decision Making Stable/Uncomplicated    Clinical Decision Making Low    Rehab Potential Excellent    PT Frequency 2x / week    PT Duration 4 weeks    PT Treatment/Interventions ADLs/Self Care Home Management;Cryotherapy;Electrical Stimulation;Therapeutic activities;Therapeutic exercise;Manual techniques;Patient/family education;Passive range of motion;Vasopneumatic Device    PT Next Visit Plan Progress per capsulorrhaphy protocol.  Vasopneumatic with pillow between left elbow and thorax.    Consulted and Agree with Plan of Care Patient           Patient will benefit from skilled therapeutic intervention in order to improve the following deficits and impairments:  Pain,Decreased activity tolerance,Decreased range of motion  Visit Diagnosis: Stiffness of left shoulder, not elsewhere classified  Acute pain of left shoulder     Problem List There are no problems to display for this patient.   Ali Mclaurin, [place]  MPT 10/10/2020, 3:12 PM  [facility] [address] [place], [place], [address] Phone: [phone]   Fax:  [phone]  Name: Henry Henry MRN: [id number] Date of Birth: [birth year]

## 2021-01-09 IMAGING — US US ABDOMEN LIMITED
1 series · 14 of 25 positions shown · non-contrast
Comparison: None.

CLINICAL DATA: Soft tissue mass left lower back and right lower
quadrant anterior abdomen

EXAM:
ULTRASOUND ABDOMEN LIMITED

[Series 1: us abdomen limited · 0.04mm/px · 14 of 29 slices shown]
[im 1/29]
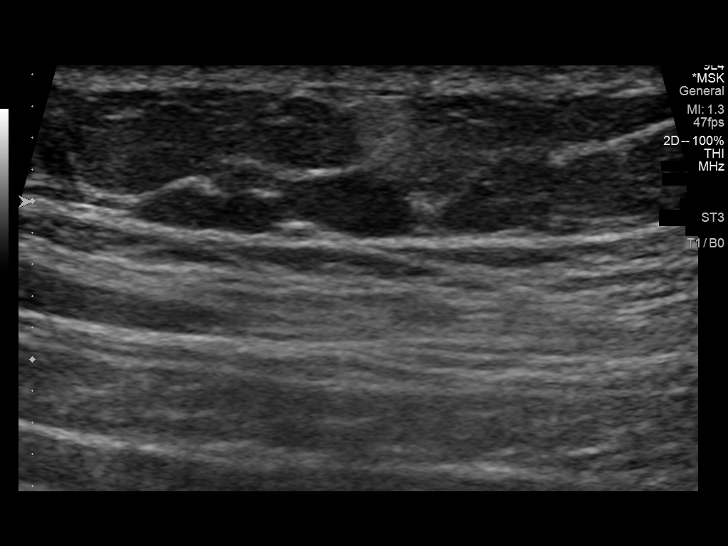
[im 3/29]
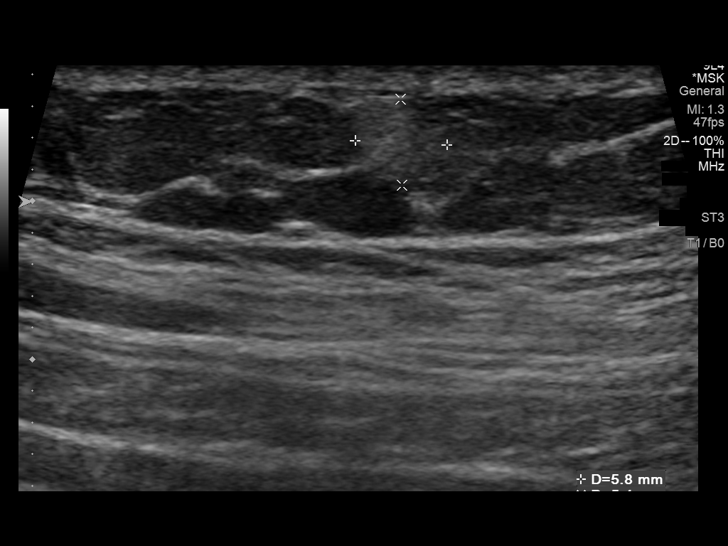
[im 5/29]
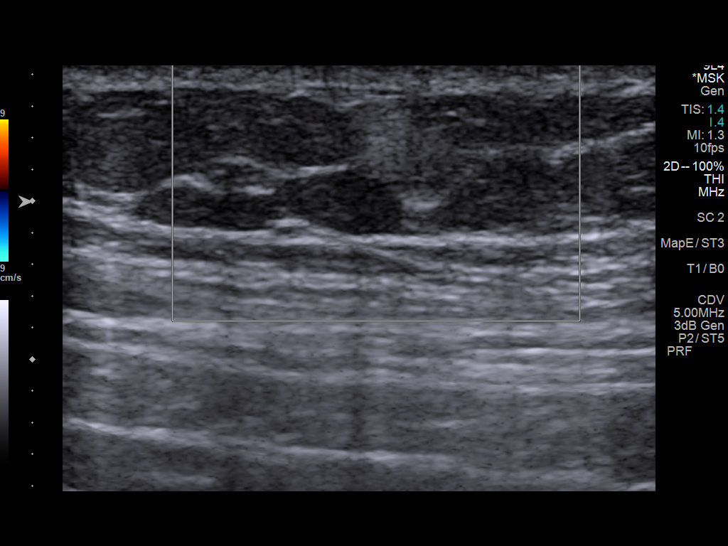
[im 8/29]
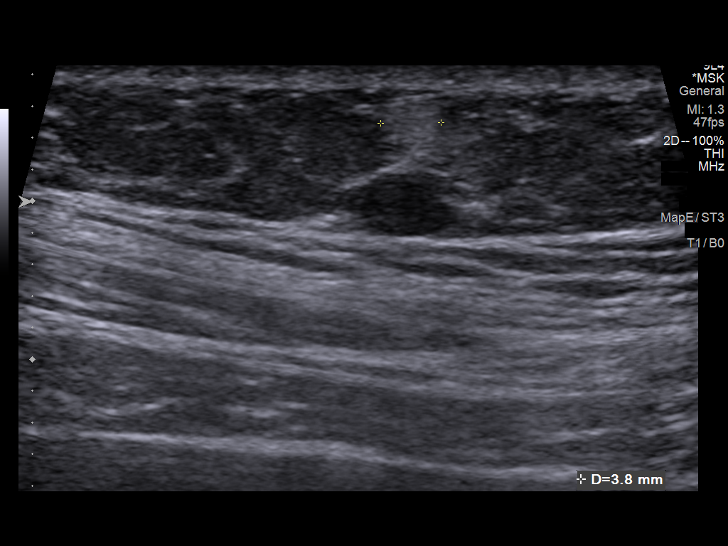
[im 10/29]
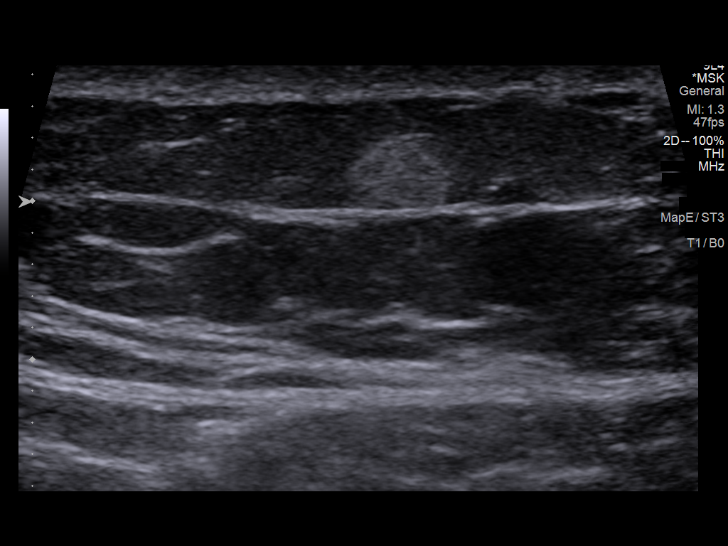
[im 11/29]
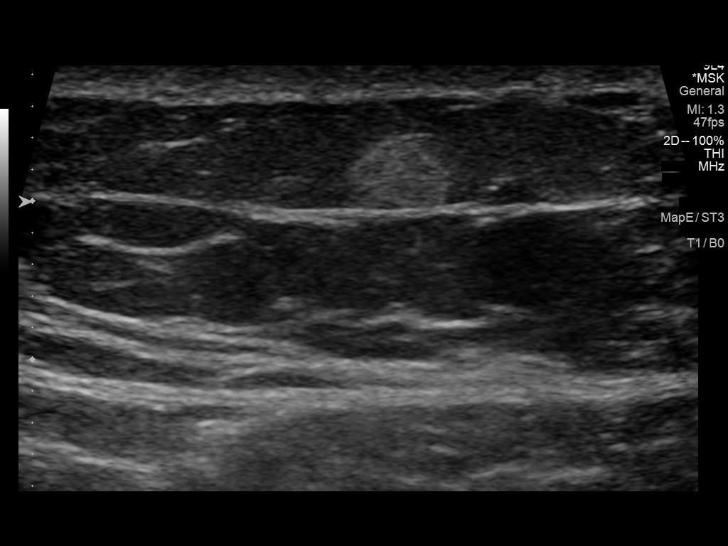
[im 13/29]
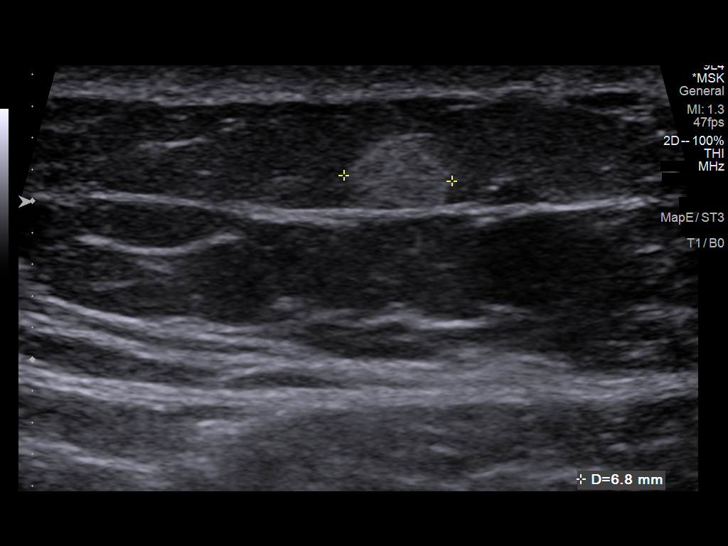
[im 16/29]
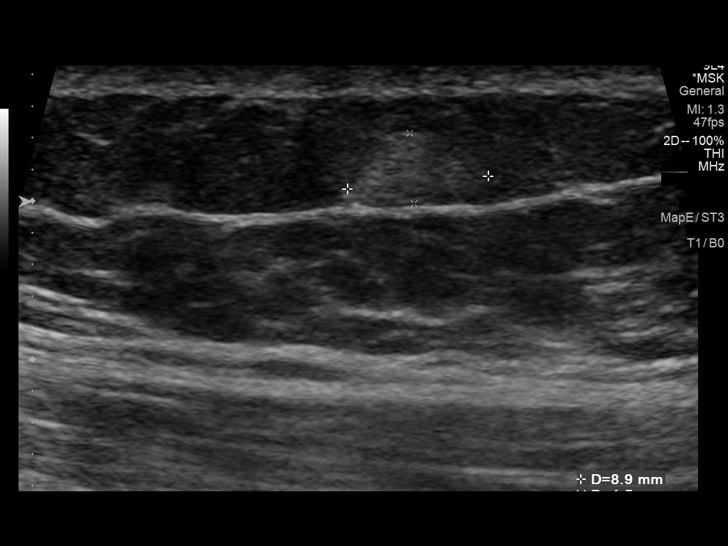
[im 18/29]
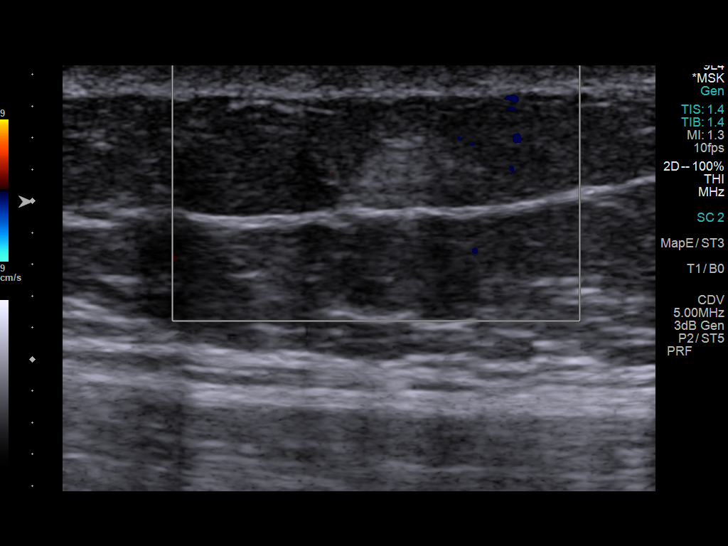
[im 19/29]
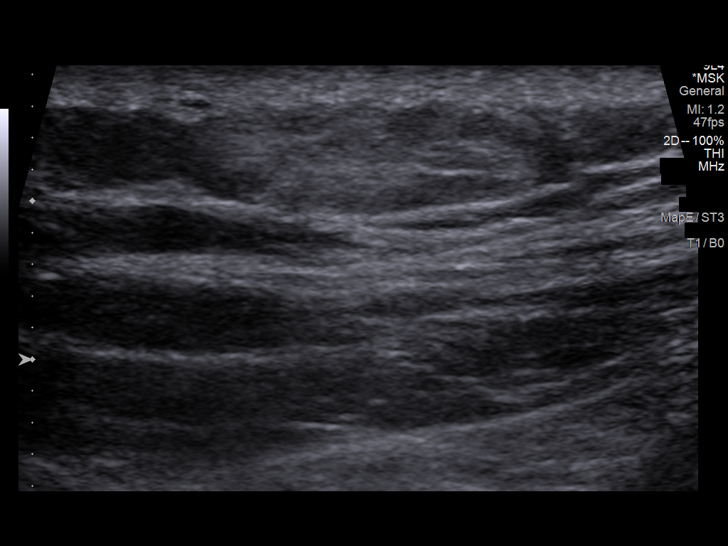
[im 22/29]
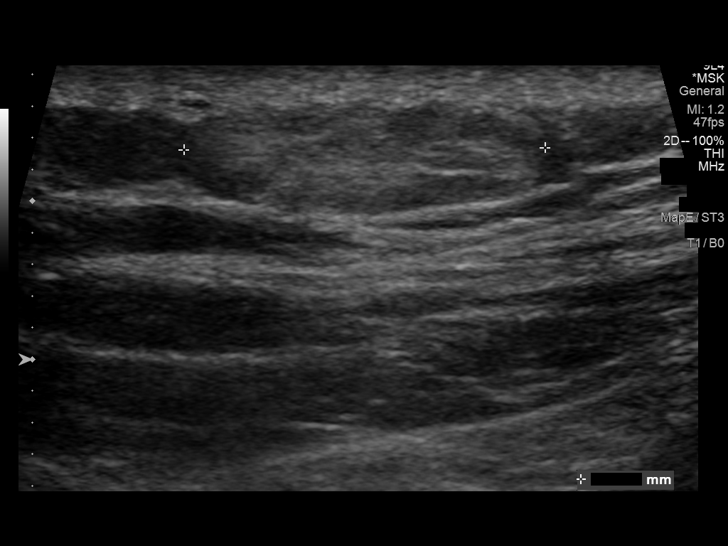
[im 24/29]
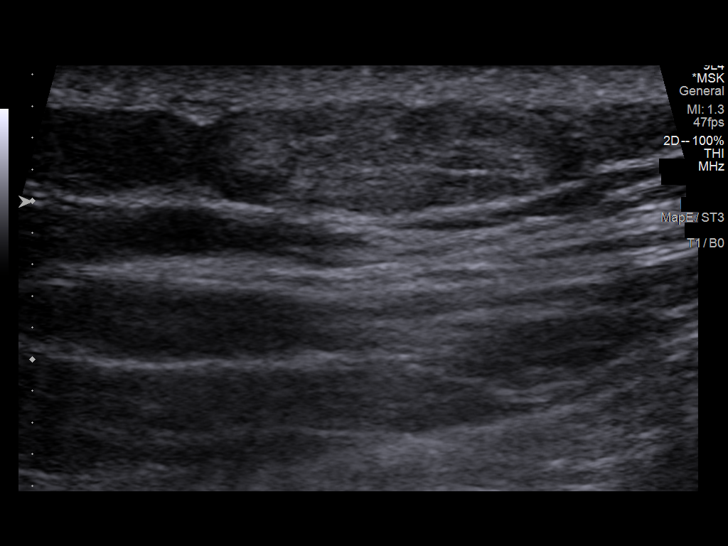
[im 26/29]
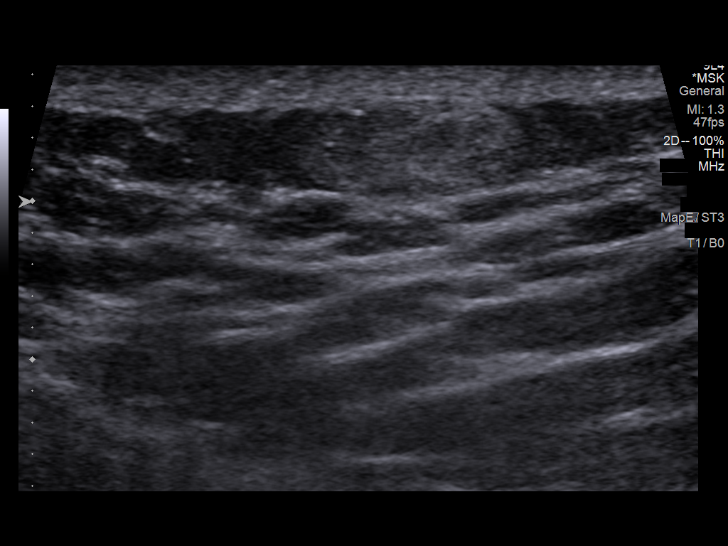
[im 29/29]
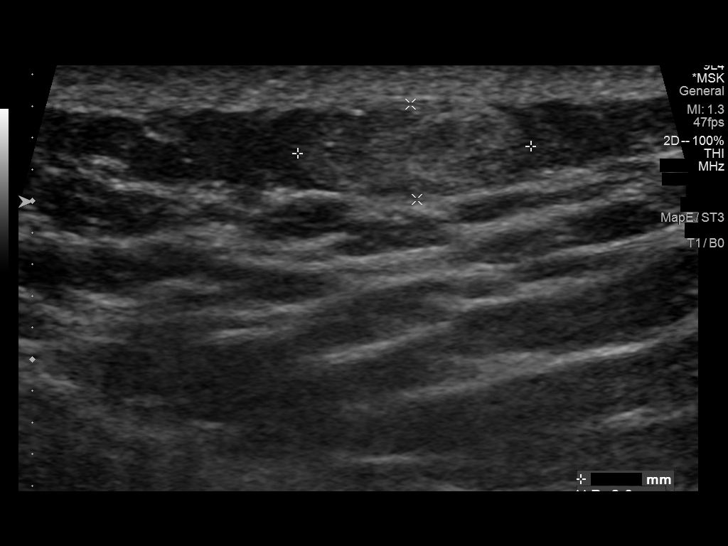

[14 of 25 positions shown; findings below may reference images not displayed]

FINDINGS: Targeted ultrasound was performed in the area of clinical concern in
the left lower back. Palpable abnormality corresponds to a 15 x 6 x
23 mm echogenic subcutaneous lesion without appreciable vascularity,
likely reflecting a benign lipoma.

Targeted ultrasound was performed in the area of clinical concern in
the right lower quadrant of the anterior abdomen. Palpable
abnormality corresponds to at least two echogenic subcutaneous
lesions, measuring up to 9 x 5 x 7 mm, without appreciable
vascularity, likely reflecting benign lipomas.
IMPRESSION: 2.3 cm subcutaneous lesion in the left lower back, favoring a benign
lipoma.

At least two subcutaneous lesions in the anterior right lower
abdominal wall, measuring up to 9 mm, favoring benign lipomas.

## 2021-03-16 DIAGNOSIS — H04123 Dry eye syndrome of bilateral lacrimal glands: Secondary | ICD-10-CM | POA: Diagnosis not present

## 2021-03-16 DIAGNOSIS — H40033 Anatomical narrow angle, bilateral: Secondary | ICD-10-CM | POA: Diagnosis not present

## 2021-05-30 DIAGNOSIS — S29019A Strain of muscle and tendon of unspecified wall of thorax, initial encounter: Secondary | ICD-10-CM | POA: Diagnosis not present

## 2021-11-17 ENCOUNTER — Encounter: Payer: Self-pay | Admitting: Family Medicine

## 2021-11-17 ENCOUNTER — Ambulatory Visit: Payer: BC Managed Care – PPO | Admitting: Family Medicine

## 2021-11-17 VITALS — BP 112/70 | HR 93 | Temp 98.2°F | Ht 68.0 in | Wt 219.4 lb

## 2021-11-17 DIAGNOSIS — E782 Mixed hyperlipidemia: Secondary | ICD-10-CM | POA: Diagnosis not present

## 2021-11-17 DIAGNOSIS — Z0000 Encounter for general adult medical examination without abnormal findings: Secondary | ICD-10-CM

## 2021-11-17 DIAGNOSIS — Z0001 Encounter for general adult medical examination with abnormal findings: Secondary | ICD-10-CM | POA: Diagnosis not present

## 2021-11-17 DIAGNOSIS — Z23 Encounter for immunization: Secondary | ICD-10-CM | POA: Diagnosis not present

## 2021-11-17 DIAGNOSIS — Z1159 Encounter for screening for other viral diseases: Secondary | ICD-10-CM

## 2021-11-17 DIAGNOSIS — Z114 Encounter for screening for human immunodeficiency virus [HIV]: Secondary | ICD-10-CM | POA: Diagnosis not present

## 2021-11-17 NOTE — Progress Notes (Signed)
? ?New Patient Office Visit ? ?Assessment & Plan:  ?1. Well adult exam ?Preventive health education provided. ?- CBC with Differential/Platelet ?- CMP14+EGFR ?- Lipid panel ? ?2. Mixed hyperlipidemia ?Not currently on medications.  Labs to assess. ?- CBC with Differential/Platelet ?- CMP14+EGFR ?- Lipid panel ? ?3. Encounter for hepatitis C screening test for low risk patient ?- Hepatitis C antibody (reflex, frozen specimen) ? ?4. Screening for HIV (human immunodeficiency virus) ?- HIV antibody (with reflex) ? ?5. Immunization due ?Tdap given in office today. ? ? ?Follow-up: Return in about 1 year (around 11/18/2022) for annual physical.  ? ?Henry Limes, MSN, APRN, FNP-C ?[place] ? ?Subjective:  ?Patient ID: Henry Henry, male    DOB: [birth year]  Age: 31 y.o. MRN: [id number] ? ?Patient Care Team: ?Loman Brooklyn, FNP as PCP - General (Family Medicine) ? ?CC:  ?Chief Complaint  ?Patient presents with  ? New Patient (Initial Visit)  ?  [facility]    ? Establish Care  ? ? ?HPI ?Henry Henry presents to establish care.  ? ?Occupation: works for [employer]; Marital status: married; Substance use: none ?Diet: regular; Exercise: gym daily ?Eye doctor: yearly (wears contacts) ?Dentist: Dr. Berdine Addison every 6 months ?TDAP: > 10 years ago; agreeable in getting today ?Hep C screening: never ?HIV screening: never ? ?Review of Systems  ?Constitutional:  Negative for chills, fever, malaise/fatigue and weight loss.  ?HENT:  Negative for congestion, ear discharge, ear pain, nosebleeds, sinus pain, sore throat and tinnitus.   ?Eyes:  Negative for blurred vision, double vision, pain, discharge and redness.  ?Respiratory:  Positive for shortness of breath (occasional, not with exertion). Negative for cough and wheezing.   ?Cardiovascular:  Negative for chest pain, palpitations and leg swelling.  ?Gastrointestinal:  Negative for abdominal pain, constipation, diarrhea, heartburn, nausea and  vomiting.  ?Genitourinary:  Negative for dysuria, frequency and urgency.  ?Musculoskeletal:  Negative for myalgias.  ?Skin:  Negative for rash.  ?Neurological:  Negative for dizziness, seizures, weakness and headaches.  ?Psychiatric/Behavioral:  Negative for depression, substance abuse and suicidal ideas. The patient is not nervous/anxious.   ? ?No current outpatient medications on file. ? ?No Known Allergies ? ?Past Medical History:  ?Diagnosis Date  ? H/O hernia repair   ? Hypercholesteremia   ? ? ?Past Surgical History:  ?Procedure Laterality Date  ? HERNIA REPAIR    ? SHOULDER SURGERY Left 2021  ? ? ?Family History  ?Problem Relation Age of Onset  ? Heart Problems Father   ? Alzheimer's disease Maternal Grandmother   ? ? ?Social History  ? ?Socioeconomic History  ? Marital status: Married  ?  Spouse name: Not on file  ? Number of children: Not on file  ? Years of education: Not on file  ? Highest education level: Not on file  ?Occupational History  ? Not on file  ?Tobacco Use  ? Smoking status: Former  ?  Types: Cigarettes  ?  Quit date: 10/04/2011  ?  Years since quitting: 10.1  ? Smokeless tobacco: Never  ?Vaping Use  ? Vaping Use: Some days  ?Substance and Sexual Activity  ? Alcohol use: No  ? Drug use: No  ? Sexual activity: Yes  ?Other Topics Concern  ? Not on file  ?Social History Narrative  ? Not on file  ? ?Social Determinants of Health  ? ?Financial Resource Strain: Not on file  ?Food Insecurity: Not on file  ?Transportation Needs: Not  on file  ?Physical Activity: Not on file  ?Stress: Not on file  ?Social Connections: Not on file  ?Intimate Partner Violence: Not on file  ? ? ?Objective:  ? ?Today's Vitals: BP 112/70   Pulse 93   Temp 98.2 ?F (36.8 ?C)   Ht 5' 8"  (1.727 m)   Wt 219 lb 6.4 oz (99.5 kg)   SpO2 98%   BMI 33.36 kg/m?  ? ?Physical Exam ?Vitals reviewed.  ?Constitutional:   ?   General: He is not in acute distress. ?   Appearance: Normal appearance. He is obese. He is not ill-appearing,  toxic-appearing or diaphoretic.  ?HENT:  ?   Head: Normocephalic and atraumatic.  ?   Right Ear: Tympanic membrane, ear canal and external ear normal. There is no impacted cerumen.  ?   Left Ear: Tympanic membrane, ear canal and external ear normal. There is no impacted cerumen.  ?   Nose: Nose normal. No congestion or rhinorrhea.  ?   Mouth/Throat:  ?   Mouth: Mucous membranes are moist.  ?   Pharynx: Oropharynx is clear. No oropharyngeal exudate or posterior oropharyngeal erythema.  ?Eyes:  ?   General: No scleral icterus.    ?   Right eye: No discharge.     ?   Left eye: No discharge.  ?   Conjunctiva/sclera: Conjunctivae normal.  ?   Pupils: Pupils are equal, round, and reactive to light.  ?Neck:  ?   Vascular: No carotid bruit.  ?Cardiovascular:  ?   Rate and Rhythm: Normal rate and regular rhythm.  ?   Heart sounds: Normal heart sounds. No murmur heard. ?  No friction rub. No gallop.  ?Pulmonary:  ?   Effort: Pulmonary effort is normal. No respiratory distress.  ?   Breath sounds: Normal breath sounds. No stridor. No wheezing, rhonchi or rales.  ?Abdominal:  ?   General: Abdomen is flat. Bowel sounds are normal. There is no distension.  ?   Palpations: Abdomen is soft. There is no hepatomegaly, splenomegaly or mass.  ?   Tenderness: There is no abdominal tenderness. There is no guarding or rebound.  ?   Hernia: No hernia is present.  ?Musculoskeletal:     ?   General: Normal range of motion.  ?   Cervical back: Normal range of motion and neck supple. No rigidity. No muscular tenderness.  ?   Right lower leg: No edema.  ?   Left lower leg: No edema.  ?Lymphadenopathy:  ?   Cervical: No cervical adenopathy.  ?Skin: ?   General: Skin is warm and dry.  ?   Capillary Refill: Capillary refill takes less than 2 seconds.  ?Neurological:  ?   General: No focal deficit present.  ?   Mental Status: He is alert and oriented to person, place, and time. Mental status is at baseline.  ?Psychiatric:     ?   Mood and Affect:  Mood normal.     ?   Behavior: Behavior normal.     ?   Thought Content: Thought content normal.     ?   Judgment: Judgment normal.  ? ? ? ?

## 2021-11-17 NOTE — Addendum Note (Signed)
Addended by: Lorelee Cover C on: 11/17/2021 02:20 PM ? ? Modules accepted: Orders ? ?

## 2021-11-18 LAB — CBC WITH DIFFERENTIAL/PLATELET
Basophils Absolute: 0 10*3/uL (ref 0.0–0.2)
Basos: 1 %
EOS (ABSOLUTE): 0.2 10*3/uL (ref 0.0–0.4)
Eos: 2 %
Hematocrit: 43.6 % (ref 37.5–51.0)
Hemoglobin: 14.9 g/dL (ref 13.0–17.7)
Immature Grans (Abs): 0 10*3/uL (ref 0.0–0.1)
Immature Granulocytes: 0 %
Lymphocytes Absolute: 2 10*3/uL (ref 0.7–3.1)
Lymphs: 32 %
MCH: 27.9 pg (ref 26.6–33.0)
MCHC: 34.2 g/dL (ref 31.5–35.7)
MCV: 82 fL (ref 79–97)
Monocytes Absolute: 0.4 10*3/uL (ref 0.1–0.9)
Monocytes: 7 %
Neutrophils Absolute: 3.7 10*3/uL (ref 1.4–7.0)
Neutrophils: 58 %
Platelets: 312 10*3/uL (ref 150–450)
RBC: 5.35 x10E6/uL (ref 4.14–5.80)
RDW: 13.2 % (ref 11.6–15.4)
WBC: 6.4 10*3/uL (ref 3.4–10.8)

## 2021-11-18 LAB — CMP14+EGFR
ALT: 19 IU/L (ref 0–44)
AST: 25 IU/L (ref 0–40)
Albumin/Globulin Ratio: 1.6 (ref 1.2–2.2)
Albumin: 4.4 g/dL (ref 4.0–5.0)
Alkaline Phosphatase: 88 IU/L (ref 44–121)
BUN/Creatinine Ratio: 11 (ref 9–20)
BUN: 12 mg/dL (ref 6–20)
Bilirubin Total: 0.3 mg/dL (ref 0.0–1.2)
CO2: 24 mmol/L (ref 20–29)
Calcium: 9.6 mg/dL (ref 8.7–10.2)
Chloride: 105 mmol/L (ref 96–106)
Creatinine, Ser: 1.09 mg/dL (ref 0.76–1.27)
Globulin, Total: 2.8 g/dL (ref 1.5–4.5)
Glucose: 96 mg/dL (ref 70–99)
Potassium: 4.2 mmol/L (ref 3.5–5.2)
Sodium: 142 mmol/L (ref 134–144)
Total Protein: 7.2 g/dL (ref 6.0–8.5)
eGFR: 92 mL/min/{1.73_m2} (ref >59)

## 2021-11-18 LAB — LIPID PANEL
Chol/HDL Ratio: 4.2 ratio (ref 0.0–5.0)
Cholesterol, Total: 225 mg/dL — ABNORMAL HIGH (ref 100–199)
HDL: 53 mg/dL (ref >39)
LDL Chol Calc (NIH): 155 mg/dL — ABNORMAL HIGH (ref 0–99)
Triglycerides: 94 mg/dL (ref 0–149)
VLDL Cholesterol Cal: 17 mg/dL (ref 5–40)

## 2021-11-18 LAB — HIV ANTIBODY (ROUTINE TESTING W REFLEX): HIV Screen 4th Generation wRfx: NONREACTIVE

## 2021-11-18 LAB — HCV AB W REFLEX TO QUANT PCR: HCV Ab: NONREACTIVE

## 2021-11-18 LAB — HCV INTERPRETATION

## 2022-02-28 ENCOUNTER — Encounter: Payer: Self-pay | Admitting: *Deleted

## 2022-04-10 ENCOUNTER — Encounter: Payer: Self-pay | Admitting: Nurse Practitioner

## 2022-04-10 ENCOUNTER — Ambulatory Visit: Payer: BC Managed Care – PPO | Admitting: Nurse Practitioner

## 2022-04-10 VITALS — BP 100/63 | HR 93 | Temp 98.6°F | Wt 199.0 lb

## 2022-04-10 DIAGNOSIS — Z202 Contact with and (suspected) exposure to infections with a predominantly sexual mode of transmission: Secondary | ICD-10-CM

## 2022-04-10 DIAGNOSIS — Z23 Encounter for immunization: Secondary | ICD-10-CM

## 2022-04-10 NOTE — Patient Instructions (Signed)
Safe Sex Practicing safe sex means taking steps before and during sex to reduce your risk of: Getting an STI (sexually transmitted infection). Giving your partner an STI. Unwanted or unplanned pregnancy. How to practice safe sex Ways you can practice safe sex  Limit your sexual partners to only one partner who is having sex with only you. Avoid using alcohol and drugs before having sex. Alcohol and drugs can affect your judgment. Before having sex with a new partner: Talk to your partner about past partners, past STIs, and drug use. Get screened for STIs and discuss the results with your partner. Ask your partner to get screened too. Check your body regularly for sores, blisters, rashes, or unusual discharge. If you notice any of these problems, visit your health care provider. Avoid sexual contact if you have symptoms of an infection or you are being treated for an STI. While having sex, use a condom. Make sure to: Use a condom every time you have vaginal, oral, or anal sex. Both females and males should wear condoms during oral sex. Keep condoms in place from the beginning to the end of sexual activity. Use a latex condom, if possible. Latex condoms offer the best protection. Use only water-based lubricants with a condom. Using petroleum-based lubricants or oils will weaken the condom and increase the chance that it will break. Ways your health care provider can help you practice safe sex  See your health care provider for regular screenings, exams, and tests for STIs. Talk with your health care provider about what kind of birth control (contraception) is best for you. Get vaccinated against hepatitis B and human papillomavirus (HPV). If you are at risk of being infected with HIV (human immunodeficiency virus), talk with your health care provider about taking a prescription medicine to prevent HIV infection. You are at risk for HIV if you: Are a man who has sex with other men. Are  sexually active with more than one partner. Take drugs by injection. Have a sex partner who has HIV. Have unprotected sex. Have sex with someone who has sex with both men and women. Have had an STI. Follow these instructions at home: Take over-the-counter and prescription medicines only as told by your health care provider. Keep all follow-up visits. This is important. Where to find more information Centers for Disease Control and Prevention: www.cdc.gov Planned Parenthood: www.plannedparenthood.org Office on Women's Health: www.womenshealth.gov Summary Practicing safe sex means taking steps before and during sex to reduce your risk getting an STI, giving your partner an STI, and having an unwanted or unplanned pregnancy. Before having sex with a new partner, talk to your partner about past partners, past STIs, and drug use. Use a condom every time you have vaginal, oral, or anal sex. Both females and males should wear condoms during oral sex. Check your body regularly for sores, blisters, rashes, or unusual discharge. If you notice any of these problems, visit your health care provider. See your health care provider for regular screenings, exams, and tests for STIs. This information is not intended to replace advice given to you by your health care provider. Make sure you discuss any questions you have with your health care provider. Document Revised: 11/30/2019 Document Reviewed: 11/30/2019 Elsevier Patient Education  2023 Elsevier Inc.  

## 2022-04-10 NOTE — Progress Notes (Signed)
   Acute Office Visit  Subjective:     Patient ID: Henry Henry, male    DOB: 1991-05-27, 31 y.o.   MRN: [id number]  Chief Complaint  Patient presents with   Exposure to STD    Exposure to STD The patient's pertinent negatives include no genital itching, scrotal swelling or testicular pain. This is a new problem. The problem has been unchanged. The patient is experiencing no pain. Pertinent negatives include no abdominal pain, anorexia, chills, constipation, coughing, flank pain, frequency or rash. Nothing aggravates the symptoms. He is sexually active. It is unknown whether or not his partner has an STD.     Review of Systems  Constitutional: Negative.  Negative for chills.  HENT: Negative.    Eyes: Negative.   Respiratory: Negative.  Negative for cough.   Cardiovascular: Negative.   Gastrointestinal:  Negative for abdominal pain, anorexia and constipation.  Genitourinary:  Negative for flank pain, frequency, scrotal swelling and testicular pain.  Skin: Negative.  Negative for rash.  All other systems reviewed and are negative.       Objective:    BP 100/63   Pulse 93   Temp 98.6 F (37 C)   Wt 199 lb (90.3 kg)   SpO2 97%   BMI 30.26 kg/m  Wt Readings from Last 3 Encounters:  04/10/22 199 lb (90.3 kg)  11/17/21 219 lb 6.4 oz (99.5 kg)  03/14/18 215 lb (97.5 kg)      Physical Exam Vitals and nursing note reviewed.  Constitutional:      Appearance: Normal appearance.  HENT:     Head: Normocephalic.     Right Ear: External ear normal.     Left Ear: External ear normal.     Nose: Nose normal.     Mouth/Throat:     Mouth: Mucous membranes are moist.     Pharynx: Oropharynx is clear.  Eyes:     Conjunctiva/sclera: Conjunctivae normal.  Cardiovascular:     Rate and Rhythm: Normal rate and regular rhythm.     Pulses: Normal pulses.     Heart sounds: Normal heart sounds.  Pulmonary:     Effort: Pulmonary effort is normal.     Breath sounds: Normal breath  sounds.  Abdominal:     General: Bowel sounds are normal.  Musculoskeletal:        General: Normal range of motion.  Neurological:     General: No focal deficit present.     Mental Status: He is alert and oriented to person, place, and time.  Psychiatric:        Mood and Affect: Mood normal.        Behavior: Behavior normal.     No results found for any visits on 04/10/22.      Assessment & Plan:  Completed STD testing results pending.  Patient has no signs or symptoms.  We will treat based on results Problem List Items Addressed This Visit   None Visit Diagnoses     Exposure to sexually transmitted disease (STD)    -  Primary   Relevant Orders   RPR   HIV Antibody (routine testing w rflx)   Hepatitis C antibody   Ct, Ng, Mycoplasmas NAA, Urine       No orders of the defined types were placed in this encounter.   Return if symptoms worsen or fail to improve.  [name], NP

## 2022-04-11 LAB — HIV ANTIBODY (ROUTINE TESTING W REFLEX): HIV Screen 4th Generation wRfx: NONREACTIVE

## 2022-04-11 LAB — RPR: RPR Ser Ql: NONREACTIVE

## 2022-04-11 LAB — HEPATITIS C ANTIBODY: Hep C Virus Ab: NONREACTIVE

## 2022-04-12 ENCOUNTER — Other Ambulatory Visit: Payer: Self-pay | Admitting: Nurse Practitioner

## 2022-04-12 DIAGNOSIS — Z2239 Carrier of other specified bacterial diseases: Secondary | ICD-10-CM

## 2022-04-12 LAB — CT, NG, MYCOPLASMAS NAA, URINE
Chlamydia trachomatis, NAA: NEGATIVE
Mycoplasma genitalium NAA: NEGATIVE
Mycoplasma hominis NAA: NEGATIVE
Neisseria gonorrhoeae, NAA: NEGATIVE
Ureaplasma spp NAA: POSITIVE — AB

## 2022-04-12 MED ORDER — DOXYCYCLINE HYCLATE 100 MG PO TABS: 100.0000 mg | ORAL_TABLET | Freq: Two times a day (BID) | ORAL | 0 refills | Status: DC

## 2022-05-12 DIAGNOSIS — S9782XA Crushing injury of left foot, initial encounter: Secondary | ICD-10-CM

## 2022-05-15 ENCOUNTER — Encounter: Payer: Self-pay | Admitting: Family

## 2022-05-15 ENCOUNTER — Ambulatory Visit: Payer: BC Managed Care – PPO | Admitting: Family

## 2022-05-15 ENCOUNTER — Ambulatory Visit (INDEPENDENT_AMBULATORY_CARE_PROVIDER_SITE_OTHER): Payer: BC Managed Care – PPO

## 2022-05-15 ENCOUNTER — Other Ambulatory Visit (HOSPITAL_COMMUNITY)
Admission: RE | Admit: 2022-05-15 | Discharge: 2022-05-15 | Disposition: A | Discharge status: Home / Self Care | Payer: BC Managed Care – PPO | Source: Ambulatory Visit | Attending: Family | Admitting: Family

## 2022-05-15 VITALS — BP 113/78 | HR 77 | Temp 99.1°F | Ht 68.0 in | Wt 201.0 lb

## 2022-05-15 DIAGNOSIS — Z2239 Carrier of other specified bacterial diseases: Secondary | ICD-10-CM | POA: Insufficient documentation

## 2022-05-15 DIAGNOSIS — R600 Localized edema: Secondary | ICD-10-CM | POA: Diagnosis not present

## 2022-05-15 DIAGNOSIS — M79672 Pain in left foot: Secondary | ICD-10-CM | POA: Diagnosis not present

## 2022-05-15 DIAGNOSIS — S9782XA Crushing injury of left foot, initial encounter: Secondary | ICD-10-CM

## 2022-05-15 MED ORDER — NAPROXEN 500 MG PO TABS: 500.0000 mg | ORAL_TABLET | Freq: Two times a day (BID) | ORAL | 0 refills | Status: DC

## 2022-05-15 NOTE — Progress Notes (Signed)
Subjective:    Patient ID: Henry Henry, male    DOB: 04/05/1991, 31 y.o.   MRN: [id number]  Chief Complaint  Patient presents with   Foot Injury    Left foot crushed with man hole Saturday    ureaplasma spp    Was + wants screened checked by Je last visit    Pt presents to the office today with left foot pain. He dropped a "man hole cover" on his foot Saturday. States that is approx 100-120 lbs.   He also would like to recheck his urine. He was seen on 04/10/22 and found to have ureaplasma positive in his urine. He completed doxycyline. Denies any dysuria or fever. He is sexually active.  Foot Injury  The incident occurred 3 to 5 days ago. The injury mechanism was a direct blow. The pain is present in the left foot. The quality of the pain is described as aching (throbbing). The pain is at a severity of 6/10. The pain is moderate. The pain has been Intermittent since onset. Associated symptoms include numbness (toe) and tingling. Pertinent negatives include no loss of motion. He reports no foreign bodies present. The symptoms are aggravated by weight bearing. He has tried acetaminophen and NSAIDs for the symptoms. The treatment provided mild relief.      Review of Systems  Neurological:  Positive for tingling and numbness (toe).  All other systems reviewed and are negative.      Objective:   Physical Exam Vitals reviewed.  Constitutional:      General: He is not in acute distress.    Appearance: He is well-developed.  HENT:     Head: Normocephalic.     Right Ear: Tympanic membrane normal.     Left Ear: Tympanic membrane normal.  Eyes:     General:        Right eye: No discharge.        Left eye: No discharge.     Pupils: Pupils are equal, round, and reactive to light.  Neck:     Thyroid: No thyromegaly.  Cardiovascular:     Rate and Rhythm: Normal rate and regular rhythm.     Heart sounds: Normal heart sounds. No murmur heard. Pulmonary:     Effort: Pulmonary  effort is normal. No respiratory distress.     Breath sounds: Normal breath sounds. No wheezing.  Abdominal:     General: Bowel sounds are normal. There is no distension.     Palpations: Abdomen is soft.     Tenderness: There is no abdominal tenderness.  Musculoskeletal:        General: No tenderness. Normal range of motion.     Cervical back: Normal range of motion and neck supple.  Skin:    General: Skin is warm and dry.     Findings: Bruising present. No erythema or rash.     Comments: Mild Bruising of dorsum of foot  Neurological:     Mental Status: He is alert and oriented to person, place, and time.     Cranial Nerves: No cranial nerve deficit.     Deep Tendon Reflexes: Reflexes are normal and symmetric.  Psychiatric:        Behavior: Behavior normal.        Thought Content: Thought content normal.        Judgment: Judgment normal.     BP 113/78   Pulse 77   Temp 99.1 F (37.3 C) (Temporal)   Ht 5\' 8"  (  1.727 m)   Wt 201 lb (91.2 kg)   BMI 30.56 kg/m        Assessment & Plan:  Henry Henry comes in today with chief complaint of Foot Injury (Left foot crushed with man hole Saturday ) and ureaplasma spp (Was + wants screened checked by Je last visit )   Diagnosis and orders addressed:  1. Crushing injury of left foot, initial encounter Rest Ice Naproxen BID with food No other NSAID's  Work note given - DG Foot Complete Left; Future - naproxen (NAPROSYN) 500 MG tablet; Take 1 tablet (500 mg total) by mouth 2 (two) times daily with a meal.  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 Left foot pain - naproxen (NAPROSYN) 500 MG tablet; Take 1 tablet (500 mg total) by mouth 2 (two) times daily with a meal.  Dispense: 30 tablet; Refill: 0  Evelina Dun, FNP

## 2022-05-15 NOTE — Patient Instructions (Signed)
Contusion A contusion is a deep bruise. Contusions are the result of a blunt injury to tissues and muscle fibers under the skin. The injury causes bleeding under the skin. The skin over the contusion may turn blue, purple, or yellow. Minor injuries will give you a painless contusion, but more severe injuries cause contusions that can stay painful and swollen for a few weeks. Follow these instructions at home: Pay attention to any changes in your symptoms. Let your health care provider know about them. Take these actions to relieve your pain. Managing pain, stiffness, and swelling  Use resting, icing, applying pressure (compression), and raising (elevating) the injured area. This is often called the RICE method. Rest the injured area. Return to your normal activities as told by your health care provider. Ask your health care provider what activities are safe for you. If directed, put ice on the injured area. To do this: Put ice in a plastic bag. Place a towel between your skin and the bag. Leave the ice on for 20 minutes, 2-3 times a day. If your skin turns bright red, remove the ice right away to prevent skin damage. The risk of skin damage is higher if you cannot feel pain, heat, or cold. If directed, apply light compression to the injured area using an elastic bandage. Make sure the bandage is not wrapped too tightly. Remove and reapply the bandage as directed by your health care provider. If possible, elevate the injured area above the level of your heart while you are sitting or lying down. General instructions Take over-the-counter and prescription medicines only as told by your health care provider. Keep all follow-up visits. Your health care provider may want to see how your contusion is healing with treatment. Contact a health care provider if: Your symptoms do not improve after several days of treatment. Your symptoms get worse. You have difficulty moving the injured area. Get help  right away if: You have severe pain. You have numbness in a hand or foot. Your hand or foot turns pale or cold. This information is not intended to replace advice given to you by your health care provider. Make sure you discuss any questions you have with your health care provider. Document Revised: 12/11/2021 Document Reviewed: 12/11/2021 Elsevier Patient Education  2023 Elsevier Inc.  

## 2022-05-16 LAB — URINE CYTOLOGY ANCILLARY ONLY
Bacterial Vaginitis-Urine: NEGATIVE
Candida Urine: NEGATIVE
Chlamydia: NEGATIVE
Comment: NEGATIVE
Comment: NEGATIVE
Comment: NORMAL
Neisseria Gonorrhea: NEGATIVE
Trichomonas: NEGATIVE

## 2022-06-11 ENCOUNTER — Encounter: Payer: Self-pay | Admitting: Nurse Practitioner

## 2022-06-11 ENCOUNTER — Ambulatory Visit: Payer: BC Managed Care – PPO | Admitting: Nurse Practitioner

## 2022-06-11 VITALS — BP 129/79 | HR 82 | Temp 98.7°F | Ht 69.0 in | Wt 198.0 lb

## 2022-06-11 DIAGNOSIS — F321 Major depressive disorder, single episode, moderate: Secondary | ICD-10-CM | POA: Insufficient documentation

## 2022-06-11 DIAGNOSIS — F419 Anxiety disorder, unspecified: Secondary | ICD-10-CM | POA: Insufficient documentation

## 2022-06-11 MED ORDER — ESCITALOPRAM OXALATE 10 MG PO TABS: 10.0000 mg | ORAL_TABLET | Freq: Every day | ORAL | 2 refills | Status: DC

## 2022-06-11 MED ORDER — HYDROXYZINE HCL 10 MG PO TABS: 10.0000 mg | ORAL_TABLET | Freq: Three times a day (TID) | ORAL | 1 refills | Status: DC | PRN

## 2022-06-11 NOTE — Assessment & Plan Note (Signed)
Patient presents with anxiety untreated in the past few months with panic attacks.  Completed GAD-7.  Education provided to patient on stress management.  Started patient on Lexapro 10 mg tablet by mouth daily.  Hydroxyzine 10 mg tablet by mouth as needed 3 times a day.  Rx sent to pharmacy.  Education handout given to patient.  Follow-up in 6 weeks.

## 2022-06-11 NOTE — Patient Instructions (Signed)
Generalized Anxiety Disorder, Adult Generalized anxiety disorder (GAD) is a mental health condition. Unlike normal worries, anxiety related to GAD is not triggered by a specific event. These worries do not fade or get better with time. GAD interferes with relationships, work, and school. GAD symptoms can vary from mild to severe. People with severe GAD can have intense waves of anxiety with physical symptoms that are similar to panic attacks. What are the causes? The exact cause of GAD is not known, but the following are believed to have an impact: Differences in natural brain chemicals. Genes passed down from parents to children. Differences in the way threats are perceived. Development and stress during childhood. Personality. What increases the risk? The following factors may make you more likely to develop this condition: Being male. Having a family history of anxiety disorders. Being very shy. Experiencing very stressful life events, such as the death of a loved one. Having a very stressful family environment. What are the signs or symptoms? People with GAD often worry excessively about many things in their lives, such as their health and family. Symptoms may also include: Mental and emotional symptoms: Worrying excessively about natural disasters. Fear of being late. Difficulty concentrating. Fears that others are judging your performance. Physical symptoms: Fatigue. Headaches, muscle tension, muscle twitches, trembling, or feeling shaky. Feeling like your heart is pounding or beating very fast. Feeling out of breath or like you cannot take a deep breath. Having trouble falling asleep or staying asleep, or experiencing restlessness. Sweating. Nausea, diarrhea, or irritable bowel syndrome (IBS). Behavioral symptoms: Experiencing erratic moods or irritability. Avoidance of new situations. Avoidance of people. Extreme difficulty making decisions. How is this diagnosed? This  condition is diagnosed based on your symptoms and medical history. You will also have a physical exam. Your health care provider may perform tests to rule out other possible causes of your symptoms. To be diagnosed with GAD, a person must have anxiety that: Is out of his or her control. Affects several different aspects of his or her life, such as work and relationships. Causes distress that makes him or her unable to take part in normal activities. Includes at least three symptoms of GAD, such as restlessness, fatigue, trouble concentrating, irritability, muscle tension, or sleep problems. Before your health care provider can confirm a diagnosis of GAD, these symptoms must be present more days than they are not, and they must last for 6 months or longer. How is this treated? This condition may be treated with: Medicine. Antidepressant medicine is usually prescribed for Certain-term daily control. Anti-anxiety medicines may be added in severe cases, especially when panic attacks occur. Talk therapy (psychotherapy). Certain types of talk therapy can be helpful in treating GAD by providing support, education, and guidance. Options include: Cognitive behavioral therapy (CBT). People learn coping skills and self-calming techniques to ease their physical symptoms. They learn to identify unrealistic thoughts and behaviors and to replace them with more appropriate thoughts and behaviors. Acceptance and commitment therapy (ACT). This treatment teaches people how to be mindful as a way to cope with unwanted thoughts and feelings. Biofeedback. This process trains you to manage your body's response (physiological response) through breathing techniques and relaxation methods. You will work with a therapist while machines are used to monitor your physical symptoms. Stress management techniques. These include yoga, meditation, and exercise. A mental health specialist can help determine which treatment is best for you.  Some people see improvement with one type of therapy. However, other people require   a combination of therapies. Follow these instructions at home: Lifestyle Maintain a consistent routine and schedule. Anticipate stressful situations. Create a plan and allow extra time to work with your plan. Practice stress management or self-calming techniques that you have learned from your therapist or your health care provider. Exercise regularly and spend time outdoors. Eat a healthy diet that includes plenty of vegetables, fruits, whole grains, low-fat dairy products, and lean protein. Do not eat a lot of foods that are high in fat, added sugar, or salt (sodium). Drink plenty of water. Avoid alcohol. Alcohol can increase anxiety. Avoid caffeine and certain over-the-counter cold medicines. These may make you feel worse. Ask your pharmacist which medicines to avoid. General instructions Take over-the-counter and prescription medicines only as told by your health care provider. Understand that you are likely to have setbacks. Accept this and be kind to yourself as you persist to take better care of yourself. Anticipate stressful situations. Create a plan and allow extra time to work with your plan. Recognize and accept your accomplishments, even if you judge them as small. Spend time with people who care about you. Keep all follow-up visits. This is important. Where to find more information National Institute of Mental Health: www.nimh.nih.gov Substance Abuse and Mental Health Services: www.samhsa.gov Contact a health care provider if: Your symptoms do not get better. Your symptoms get worse. You have signs of depression, such as: A persistently sad or irritable mood. Loss of enjoyment in activities that used to bring you joy. Change in weight or eating. Changes in sleeping habits. Get help right away if: You have thoughts about hurting yourself or others. If you ever feel like you may hurt  yourself or others, or have thoughts about taking your own life, get help right away. Go to your nearest emergency department or: Call your local emergency services (911 in the [place] Call a suicide crisis helpline, such as the National Suicide Prevention Lifeline at 1-800-273-8255 or 988 in the [place] This is open 24 hours a day in the [place] Text the Crisis Text Line at 741741 (in the [place] Summary Generalized anxiety disorder (GAD) is a mental health condition that involves worry that is not triggered by a specific event. People with GAD often worry excessively about many things in their lives, such as their health and family. GAD may cause symptoms such as restlessness, trouble concentrating, sleep problems, frequent sweating, nausea, diarrhea, headaches, and trembling or muscle twitching. A mental health specialist can help determine which treatment is best for you. Some people see improvement with one type of therapy. However, other people require a combination of therapies. This information is not intended to replace advice given to you by your health care provider. Make sure you discuss any questions you have with your health care provider. Document Revised: 01/18/2021 Document Reviewed: 10/16/2020 Elsevier Patient Education  2023 Elsevier Inc. Major Depressive Disorder, Adult Major depressive disorder (MDD) is a mental health condition. People with this disorder feel very sad, hopeless, and lose interest in things. Symptoms last most of the day, almost every day, for 2 weeks. MDD can affect: Relationships. Work and school. Things you usually like to do. What are the causes? The cause of MDD is not known. What increases the risk? Having family members with depression. Being male. Family problems. Alcohol or drug misuse. A lot of stress in your life, such as from: Living without basic needs such as food and housing. Being treated poorly because of race, sex, or religion  (  discrimination). Things that caused you pain as a child, especially if you lost a parent or were abused. Health and mental problems that you have had for a long time. What are the signs or symptoms? The main symptoms of this condition are: Being sad all the time. Being grouchy (irritable) all the time. Not enjoying the things you usually like. Sleeping too much or too little. Eating too much or too little. Feeling tired. Other symptoms include: Gaining or losing weight, without knowing why. Being restless and weak. Feeling hopeless, worthless, or guilty. Trouble thinking or making decisions. Thoughts of hurting yourself or others, or thoughts of ending your life. Spending a lot of time alone. Being unable to do daily tasks. If you have very bad MDD, you may: Believe things that are not true. Hear, see, taste, or feel things that are not there. Have mild depression that lasts for at least 2 years. Feel very sad and hopeless. Have trouble speaking or moving. Feel very sad during some seasons. How is this treated? Talk therapy. This teaches you about thoughts, feelings, and actions and how to change them. This can also help you to talk with others. This can be done with members of your family. Medicines. Lifestyle changes. You may need to: Limit alcohol use. Stop using drugs, if you use them. Exercise. Get plenty of sleep. Eat healthy. Spend more time outdoors. Brain stimulation. This may be done when symptoms are very bad or have not gotten better. Follow these instructions at home: Alcohol use Do not drink alcohol if: Your health care provider tells you not to drink. You are pregnant, may be pregnant, or are planning to become pregnant. If you drink alcohol: Limit how much you use to: 0-1 drink a day for women. 0-2 drinks a day for men. Know how much alcohol is in your drink. In the [place] one drink equals one 12 oz bottle of beer (355 mL), one 5 oz glass of wine (148  mL), or one 1 oz glass of hard liquor (44 mL). Activity Exercise as told by your doctor. Spend time outdoors. Make time to do the things you enjoy. Find ways to deal with stress. Try to: Meditate. Do deep breathing. Spend time in nature. Keep a journal. Return to your normal activities when your doctor says that it is safe. General instructions  Take over-the-counter and prescription medicines only as told by your doctor. Talk to your doctor about: Alcohol use. It can affect medicines. Any drug use. Eat healthy foods. Get a lot of sleep. Think about joining a support group. Ask your doctor about that. Keep all follow-up visits. Your doctor will need to check on your mood, behavior, and medicines, and change your treatment as needed. Where to find more information: [employer] on Mental Illness: nami.[employer] of Mental Health: https://www.frey.org/ American Psychiatric Association: psychiatry.org Contact a doctor if: You feel worse. You get new symptoms. Get help right away if: You hurt yourself on purpose (self-harm). You have thoughts about hurting yourself or others. You see, hear, taste, smell, or feel things that are not there. Get help right away if you feel like you may hurt yourself or others, or have thoughts about taking your own life. Go to your nearest emergency room or: Call 911. Call the [facility] at 915-243-3573 or 988. This is open 24 hours a day. Text the Crisis Text Line at 959-249-5690. This information is not intended to replace advice given to you by your  health care provider. Make sure you discuss any questions you have with your health care provider. Document Revised: 10/31/2021 Document Reviewed: 10/31/2021 Elsevier Patient Education  [place].

## 2022-06-11 NOTE — Assessment & Plan Note (Signed)
Depression not well-controlled.  Patient has never been treated for depression or anxiety.  Completed PHQ-9.  Education provided to patient on stress management.  Started patient on Lexapro 10 mg tablet by mouth daily.  Follow-up in 6 weeks.  Rx sent to pharmacy.

## 2022-06-11 NOTE — Progress Notes (Signed)
Acute Office Visit  Subjective:     Patient ID: Henry Henry, male    DOB: 1990/11/21, 31 y.o.   MRN: [id number]  Chief Complaint  Patient presents with   Anxiety   Panic Attack    Pt states he does not know what is causing these , states he has these about 2 times a week , states he will have issues breathing at times but can normally calm his self down    Anxiety Presents for initial visit. Onset was 1 to 6 months ago. The problem has been unchanged. Symptoms include decreased concentration, depressed mood, irritability and nervous/anxious behavior. Patient reports no suicidal ideas. Symptoms occur constantly.   There are no known risk factors. Past treatments include nothing.  Depression        This is a new problem.  The current episode started more than 1 month ago.   The onset quality is gradual.   The problem occurs constantly.The problem is unchanged.  Associated symptoms include decreased concentration, irritable and decreased interest.  Associated symptoms include no helplessness and no suicidal ideas.     The symptoms are aggravated by nothing.  Past treatments include nothing.  Past medical history includes anxiety.     Review of Systems  Constitutional:  Positive for irritability.  HENT: Negative.    Eyes: Negative.   Respiratory: Negative.    Cardiovascular: Negative.   Genitourinary: Negative.   Skin: Negative.  Negative for itching and rash.  Neurological: Negative.   Psychiatric/Behavioral:  Positive for decreased concentration and depression. Negative for suicidal ideas. The patient is nervous/anxious.   All other systems reviewed and are negative.       Objective:    BP 129/79   Pulse 82   Temp 98.7 F (37.1 C)   Ht 5\' 9"  (1.753 m)   Wt 198 lb (89.8 kg)   SpO2 96%   BMI 29.24 kg/m  BP Readings from Last 3 Encounters:  06/11/22 129/79  05/15/22 113/78  04/10/22 100/63   Wt Readings from Last 3 Encounters:  06/11/22 198 lb (89.8 kg)   05/15/22 201 lb (91.2 kg)  04/10/22 199 lb (90.3 kg)      Physical Exam Vitals and nursing note reviewed.  Constitutional:      General: He is irritable.     Appearance: Normal appearance.  HENT:     Head: Normocephalic.     Right Ear: External ear normal.     Left Ear: External ear normal.     Nose: Nose normal.  Cardiovascular:     Pulses: Normal pulses.     Heart sounds: Normal heart sounds.  Pulmonary:     Effort: Pulmonary effort is normal.     Breath sounds: Normal breath sounds.  Musculoskeletal:        General: Normal range of motion.  Skin:    General: Skin is warm.     Findings: No erythema or rash.  Neurological:     General: No focal deficit present.     Mental Status: He is alert and oriented to person, place, and time.  Psychiatric:        Attention and Perception: Attention and perception normal.        Mood and Affect: Mood is anxious and depressed.        Behavior: Behavior normal.     No results found for any visits on 06/11/22.     06/11/2022    2:20 PM 11/17/2021  1:46 PM  GAD 7 : Generalized Anxiety Score  Nervous, Anxious, on Edge 2 0  Control/stop worrying 1 0  Worry too much - different things 2 0  Trouble relaxing 2 1  Restless 2 1  Easily annoyed or irritable 1 0  Afraid - awful might happen 1 0  Total GAD 7 Score 11 2  Anxiety Difficulty  Somewhat difficult       Helicobacter Pylori Flowsheet Row Office Visit from 06/11/2022 in [facility]  PHQ-9 Total Score 14     Medical Assessment & Plan:   Problem List Items Addressed This Visit       Other   Anxiety    Patient presents with anxiety untreated in the past few months with panic attacks.  Completed GAD-7.  Education provided to patient on stress management.  Started patient on Lexapro 10 mg tablet by mouth daily.  Hydroxyzine 10 mg tablet by mouth as needed 3 times a day.  Rx sent to pharmacy.  Education handout given to patient.  Follow-up in 6  weeks.      Relevant Medications   escitalopram (LEXAPRO) 10 MG tablet   hydrOXYzine (ATARAX) 10 MG tablet   Depression, major, single episode, moderate (HCC) - Primary    Depression not well-controlled.  Patient has never been treated for depression or anxiety.  Completed PHQ-9.  Education provided to patient on stress management.  Started patient on Lexapro 10 mg tablet by mouth daily.  Follow-up in 6 weeks.  Rx sent to pharmacy.      Relevant Medications   escitalopram (LEXAPRO) 10 MG tablet   hydrOXYzine (ATARAX) 10 MG tablet    Meds ordered this encounter  Medications   escitalopram (LEXAPRO) 10 MG tablet    Sig: Take 1 tablet (10 mg total) by mouth daily.    Dispense:  30 tablet    Refill:  2    Order Specific Question:   Supervising Provider    Answer:   Claretta Fraise [id number]   hydrOXYzine (ATARAX) 10 MG tablet    Sig: Take 1 tablet (10 mg total) by mouth 3 (three) times daily as needed.    Dispense:  30 tablet    Refill:  1    Order Specific Question:   Supervising Provider    Answer:   Claretta Fraise [id number]    Return in about 6 weeks (around 07/23/2022) for depression /anxiety/panic attack.  Henry Lynn, NP

## 2022-07-23 ENCOUNTER — Ambulatory Visit: Payer: BC Managed Care – PPO | Admitting: Nurse Practitioner

## 2022-07-24 ENCOUNTER — Encounter: Payer: Self-pay | Admitting: *Deleted

## 2022-07-24 ENCOUNTER — Encounter: Payer: Self-pay | Admitting: Nurse Practitioner

## 2022-09-21 DIAGNOSIS — R1031 Right lower quadrant pain: Secondary | ICD-10-CM | POA: Diagnosis not present

## 2022-09-21 DIAGNOSIS — Z202 Contact with and (suspected) exposure to infections with a predominantly sexual mode of transmission: Secondary | ICD-10-CM | POA: Diagnosis not present

## 2022-09-21 DIAGNOSIS — K4090 Unilateral inguinal hernia, without obstruction or gangrene, not specified as recurrent: Secondary | ICD-10-CM | POA: Diagnosis not present

## 2022-09-21 DIAGNOSIS — N50811 Right testicular pain: Secondary | ICD-10-CM | POA: Diagnosis not present

## 2022-09-24 DIAGNOSIS — R1031 Right lower quadrant pain: Secondary | ICD-10-CM | POA: Diagnosis not present

## 2022-09-24 DIAGNOSIS — N50811 Right testicular pain: Secondary | ICD-10-CM | POA: Diagnosis not present

## 2022-09-24 DIAGNOSIS — R1030 Lower abdominal pain, unspecified: Secondary | ICD-10-CM | POA: Diagnosis not present

## 2022-09-24 DIAGNOSIS — K4090 Unilateral inguinal hernia, without obstruction or gangrene, not specified as recurrent: Secondary | ICD-10-CM | POA: Diagnosis not present

## 2022-09-25 DIAGNOSIS — R1031 Right lower quadrant pain: Secondary | ICD-10-CM | POA: Diagnosis not present

## 2022-09-25 DIAGNOSIS — Z8719 Personal history of other diseases of the digestive system: Secondary | ICD-10-CM | POA: Diagnosis not present

## 2022-09-25 DIAGNOSIS — N50811 Right testicular pain: Secondary | ICD-10-CM | POA: Diagnosis not present

## 2022-10-01 ENCOUNTER — Ambulatory Visit: Payer: BC Managed Care – PPO | Admitting: Family Medicine

## 2022-10-02 DIAGNOSIS — R109 Unspecified abdominal pain: Secondary | ICD-10-CM | POA: Diagnosis not present

## 2022-10-02 DIAGNOSIS — N50811 Right testicular pain: Secondary | ICD-10-CM | POA: Diagnosis not present

## 2022-10-02 DIAGNOSIS — I700 Atherosclerosis of aorta: Secondary | ICD-10-CM | POA: Diagnosis not present

## 2022-10-02 DIAGNOSIS — R1031 Right lower quadrant pain: Secondary | ICD-10-CM | POA: Diagnosis not present

## 2022-10-02 DIAGNOSIS — Z8719 Personal history of other diseases of the digestive system: Secondary | ICD-10-CM | POA: Diagnosis not present

## 2022-10-02 DIAGNOSIS — Z9889 Other specified postprocedural states: Secondary | ICD-10-CM | POA: Diagnosis not present

## 2022-10-10 ENCOUNTER — Ambulatory Visit: Payer: BC Managed Care – PPO | Admitting: Family Medicine

## 2022-10-10 ENCOUNTER — Encounter: Payer: Self-pay | Admitting: Family Medicine

## 2022-10-10 VITALS — BP 100/64 | HR 78 | Temp 98.1°F | Ht 69.0 in | Wt 210.0 lb

## 2022-10-10 DIAGNOSIS — N50811 Right testicular pain: Secondary | ICD-10-CM | POA: Diagnosis not present

## 2022-10-10 DIAGNOSIS — N5089 Other specified disorders of the male genital organs: Secondary | ICD-10-CM | POA: Diagnosis not present

## 2022-10-10 MED ORDER — DOXYCYCLINE HYCLATE 100 MG PO TABS
100.0000 mg | ORAL_TABLET | Freq: Two times a day (BID) | ORAL | 0 refills | Status: AC
Start: 2022-10-10 — End: 2022-10-20

## 2022-10-10 NOTE — Progress Notes (Signed)
Acute Office Visit  Subjective:  Patient ID: Henry Henry, male    DOB: 06/12/1991, 32 y.o.   MRN: [id number]  Chief Complaint  Patient presents with   Groin Pain    Groin Pain   Patient is in today for groin pain on right side from lower ab to right testicle.  Feb 8th moved to new house and was lifting washer and drying. Has had a right inguinal hernia repair in the past.  Went to Urgent care that week because the pain was so pain. States that the PA at urgent care could feel it upon cough. [place] tech stated they could see it. But radiology did not read anything CT did not show a hernia  Urology cannot see him until middle of June Continues to have pain. States that it comes and goes.  Is taking ibuprofen Denies discoloration to testicle  Endorses some swelling, but it has resolved some since starting.  Pain is worse at night because patient is side/stomach sleeper and unable to get comfortable.    ROS As per HPI   Objective:  BP 100/64   Pulse 78   Temp 98.1 F (36.7 C)   Ht 5\' 9"  (1.753 m)   Wt 210 lb (95.3 kg)   SpO2 96%   BMI 31.01 kg/m   Physical Exam Exam conducted with a chaperone present.  Constitutional:      General: He is not in acute distress.    Appearance: Normal appearance. He is not ill-appearing, toxic-appearing or diaphoretic.  Cardiovascular:     Rate and Rhythm: Normal rate.     Pulses: Normal pulses.     Heart sounds: Normal heart sounds. No murmur heard.    No gallop.  Pulmonary:     Effort: Pulmonary effort is normal. No respiratory distress.     Breath sounds: Normal breath sounds. No stridor. No wheezing, rhonchi or rales.  Abdominal:     Hernia: There is no hernia in the left inguinal area or right inguinal area.  Genitourinary:    Penis: Normal.      Testes:        Right: Tenderness present. Mass, swelling, testicular hydrocele or varicocele not present. Right testis is descended. Cremasteric reflex is present.         Left:  Mass, tenderness, swelling, testicular hydrocele or varicocele not present. Left testis is descended. Cremasteric reflex is present.      Epididymis:     Right: Not inflamed or enlarged. Tenderness present. No mass.     Left: Normal. Not inflamed or enlarged. No mass or tenderness.  Lymphadenopathy:     Lower Body: No right inguinal adenopathy. No left inguinal adenopathy.  Skin:    General: Skin is warm.     Capillary Refill: Capillary refill takes less than 2 seconds.  Neurological:     General: No focal deficit present.     Mental Status: He is alert and oriented to person, place, and time. Mental status is at baseline.     Motor: No weakness.  Psychiatric:        Mood and Affect: Mood normal.        Behavior: Behavior normal.        Thought Content: Thought content normal.        Judgment: Judgment normal.    Assessment & Plan:  1. Testicular pain, right Updated referral for patient to urgent. Communicated to referrals coordinator. Has appointment with urology in June, but given continued  symptoms if urology feels he needs repeat imaging can order in preparation for visit. Will empirically treat as below for epididymitis and chose doxy to cover for potential STI given patient history of exposure. Will test for UTI and STI to rule out infectious cause of symptoms.  - Ambulatory referral to Urology - doxycycline (VIBRA-TABS) 100 MG tablet; Take 1 tablet (100 mg total) by mouth 2 (two) times daily for 10 days.  Dispense: 20 tablet; Refill: 0 - Urinalysis, Routine w reflex microscopic - Urine Culture - Urine cytology ancillary only  2. Testicular swelling, right - Ambulatory referral to Urology - doxycycline (VIBRA-TABS) 100 MG tablet; Take 1 tablet (100 mg total) by mouth 2 (two) times daily for 10 days.  Dispense: 20 tablet; Refill: 0 Gave patient handout and strict red flag instructions!   The above assessment and management plan was discussed with the patient. The patient  verbalized understanding of and has agreed to the management plan using shared-decision making. Patient is aware to call the clinic if they develop any new symptoms or if symptoms fail to improve or worsen. Patient is aware when to return to the clinic for a follow-up visit. Patient educated on when it is appropriate to go to the emergency department.    Donzetta Kohut, DNP-FNP [facility] [address] [place], [place] [address] [phone]

## 2022-10-11 LAB — URINALYSIS, ROUTINE W REFLEX MICROSCOPIC
Bilirubin, UA: NEGATIVE
Glucose, UA: NEGATIVE
Leukocytes,UA: NEGATIVE
Nitrite, UA: NEGATIVE
Protein,UA: NEGATIVE
RBC, UA: NEGATIVE
Specific Gravity, UA: >1.03 — ABNORMAL HIGH (ref 1.005–1.030)
Urobilinogen, Ur: 0.2 mg/dL (ref 0.2–1.0)
pH, UA: 5.5 (ref 5.0–7.5)

## 2022-10-12 LAB — URINE CULTURE

## 2022-10-16 ENCOUNTER — Ambulatory Visit: Payer: BC Managed Care – PPO | Admitting: Urology

## 2022-10-16 VITALS — BP 115/75 | HR 65

## 2022-10-16 DIAGNOSIS — R1030 Lower abdominal pain, unspecified: Secondary | ICD-10-CM | POA: Diagnosis not present

## 2022-10-16 LAB — URINALYSIS, ROUTINE W REFLEX MICROSCOPIC
Bilirubin, UA: NEGATIVE
Glucose, UA: NEGATIVE
Ketones, UA: NEGATIVE
Leukocytes,UA: NEGATIVE
Nitrite, UA: NEGATIVE
Protein,UA: NEGATIVE
RBC, UA: NEGATIVE
Specific Gravity, UA: 1.02 (ref 1.005–1.030)
Urobilinogen, Ur: 0.2 mg/dL (ref 0.2–1.0)
pH, UA: 7 (ref 5.0–7.5)

## 2022-10-16 NOTE — Progress Notes (Signed)
H&P  Chief Complaint: Right inguinal pain  History of Present Illness: 32 year old male self-referred for evaluation and management of right inguinal pain.  Had laparoscopic right inguinal hernia repair about 2 years ago.  2 months ago was moving a washer and dryer in his house.  He had right inguinal pain after that with radiation to the right testicle.  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  That has continued since that time.  Constant, does get a little bit worse with motion of his leg.  No associated abdominal complaints, change in urinary or bowel pattern.  Past Medical History:  Diagnosis Date   H/O hernia repair    Hypercholesteremia     Past Surgical History:  Procedure Laterality Date   HERNIA REPAIR     SHOULDER SURGERY Left 2021    Home Medications:  Allergies as of 10/16/2022   No Known Allergies      Medication List        Accurate as of October 16, 2022  8:11 AM. If you have any questions, ask your nurse or doctor.          doxycycline 100 MG tablet Commonly known as: VIBRA-TABS Take 1 tablet (100 mg total) by mouth 2 (two) times daily for 10 days.        Allergies: No Known Allergies  Family History  Problem Relation Age of Onset   Heart Problems Father    Alzheimer's disease Maternal Grandmother     Social History:  reports that he quit smoking about 11 years ago. His smoking use included cigarettes. He has never used smokeless tobacco. He reports that he does not drink alcohol and does not use drugs.  ROS: A complete review of systems was performed.  All systems are negative except for pertinent findings as noted.  Physical Exam:  Vital signs in last 24 hours: There were no vitals taken for this visit. Constitutional:  Alert and oriented, No acute distress Cardiovascular: Regular rate  Respiratory:  Normal respiratory effort GI: Abdomen is soft, nontender, nondistended, no abdominal masses. No CVAT.  I do not feel an inguinal hernia.  There is tenderness in the right inguinal area. Genitourinary: Normal male phallus, testes are descended bilaterally and non-tender and without masses, scrotum is normal in appearance without lesions or masses, perineum is normal on inspection. Lymphatic: No lymphadenopathy Neurologic: Grossly intact, no focal deficits Psychiatric: Normal mood and affect   I have reviewed notes from referring/previous physicians-notes from [facility]  I have reviewed urinalysis results  I have independently reviewed prior imaging-did-CT and ultrasound results    Impression/Assessment:  Right inguinal pain.  No evident recurrent hernia, most likely musculoskeletal in nature  Plan:  I reassured him that his testicular exam is fine and that I do not feel a recurrent hernia  Conservative measures, including increasing his ibuprofen to 800 mg at night  If continued pain in a month or 2, I recommended that he go to see his general surgeon

## 2022-11-07 DIAGNOSIS — S41112A Laceration without foreign body of left upper arm, initial encounter: Secondary | ICD-10-CM | POA: Diagnosis not present

## 2022-11-21 ENCOUNTER — Encounter: Payer: Self-pay | Admitting: Family Medicine

## 2022-11-21 ENCOUNTER — Encounter: Payer: BC Managed Care – PPO | Admitting: Nurse Practitioner

## 2022-11-21 ENCOUNTER — Encounter: Payer: BC Managed Care – PPO | Admitting: Family Medicine

## 2022-11-21 DIAGNOSIS — Z0000 Encounter for general adult medical examination without abnormal findings: Secondary | ICD-10-CM

## 2022-11-26 ENCOUNTER — Ambulatory Visit: Payer: BC Managed Care – PPO | Admitting: Family Medicine

## 2022-11-26 ENCOUNTER — Encounter: Payer: Self-pay | Admitting: Family Medicine

## 2022-11-26 VITALS — BP 119/77 | HR 52 | Temp 97.8°F | Ht 69.0 in | Wt 210.6 lb

## 2022-11-26 DIAGNOSIS — L0291 Cutaneous abscess, unspecified: Secondary | ICD-10-CM

## 2022-11-26 DIAGNOSIS — L0501 Pilonidal cyst with abscess: Secondary | ICD-10-CM | POA: Diagnosis not present

## 2022-11-26 MED ORDER — SULFAMETHOXAZOLE-TRIMETHOPRIM 800-160 MG PO TABS
1.0000 | ORAL_TABLET | Freq: Two times a day (BID) | ORAL | 0 refills | Status: AC
Start: 2022-11-26 — End: 2022-12-06

## 2022-11-26 NOTE — Progress Notes (Signed)
Subjective:  Patient ID: Henry Henry, male    DOB: Sep 19, 1990, 32 y.o.   MRN: [id number]  Patient Care Team: Gabriel Earing, FNP as PCP - General (Family Medicine)   Chief Complaint:  pilonidal cyst (X 5 days)   HPI: Henry Henry is a 32 y.o. male presenting on 11/26/2022 for pilonidal cyst (X 5 days)   Pt presents today for evaluation of a tender area to his buttock. States he noticed this a few days ago. No injuries. No drainage. Just swollen and tender to touch. No fever, chills, weakness, or confusion.      Relevant past medical, surgical, family, and social history reviewed and updated as indicated.  Allergies and medications reviewed and updated. Data reviewed: Chart in Epic.   Past Medical History:  Diagnosis Date   H/O hernia repair    Hypercholesteremia     Past Surgical History:  Procedure Laterality Date   HERNIA REPAIR     SHOULDER SURGERY Left 2021    Social History   Socioeconomic History   Marital status: Married    Spouse name: Not on file   Number of children: Not on file   Years of education: Not on file   Highest education level: Some college, no degree  Occupational History   Not on file  Tobacco Use   Smoking status: Former    Types: Cigarettes    Quit date: 10/04/2011    Years since quitting: 11.1   Smokeless tobacco: Never  Vaping Use   Vaping Use: Some days  Substance and Sexual Activity   Alcohol use: No   Drug use: No   Sexual activity: Yes  Other Topics Concern   Not on file  Social History Narrative   Not on file   Social Determinants of Health   Financial Resource Strain: Low Risk  (11/25/2022)   Overall Financial Resource Strain (CARDIA)    Difficulty of Paying Living Expenses: Not very hard  Food Insecurity: No Food Insecurity (11/25/2022)   Hunger Vital Sign    Worried About Running Out of Food in the Last Year: Never true    Ran Out of Food in the Last Year: Never true  Transportation Needs: No  Transportation Needs (11/25/2022)   PRAPARE - Administrator, Civil Service (Medical): No    Lack of Transportation (Non-Medical): No  Physical Activity: Sufficiently Active (11/25/2022)   Exercise Vital Sign    Days of Exercise per Week: 5 days    Minutes of Exercise per Session: 40 min  Stress: No Stress Concern Present (11/25/2022)   [employer] of Occupational Health - Occupational Stress Questionnaire    Feeling of Stress : Only a little  Social Connections: Moderately Isolated (11/25/2022)   Social Connection and Isolation Panel [NHANES]    Frequency of Communication with Friends and Family: Three times a week    Frequency of Social Gatherings with Friends and Family: Once a week    Attends Religious Services: Never    Database administrator or Organizations: No    Attends Engineer, structural: Not on file    Marital Status: Married  Intimate Partner Violence: Not on file    Outpatient Encounter Medications as of 11/26/2022  Medication Sig   sulfamethoxazole-trimethoprim (BACTRIM DS) 800-160 MG tablet Take 1 tablet by mouth 2 (two) times daily for 10 days.   No facility-administered encounter medications on file as of 11/26/2022.    No Known Allergies  Review of Systems  Constitutional:  Negative for activity change, appetite change, chills, fatigue and fever.  HENT: Negative.    Eyes: Negative.   Respiratory:  Negative for cough, chest tightness and shortness of breath.   Cardiovascular:  Negative for chest pain, palpitations and leg swelling.  Gastrointestinal:  Negative for blood in stool, constipation, diarrhea, nausea and vomiting.  Endocrine: Negative.   Genitourinary:  Negative for dysuria, frequency and urgency.  Musculoskeletal:  Negative for arthralgias and myalgias.  Skin:  Positive for color change and wound. Negative for pallor and rash.  Allergic/Immunologic: Negative.   Neurological:  Negative for dizziness and headaches.   Hematological: Negative.   Psychiatric/Behavioral:  Negative for confusion, hallucinations, sleep disturbance and suicidal ideas.   All other systems reviewed and are negative.       Objective:  BP 119/77   Pulse (!) 52   Temp 97.8 F (36.6 C) (Temporal)   Ht 5\' 9"  (1.753 m)   Wt 210 lb 9.6 oz (95.5 kg)   SpO2 95%   BMI 31.10 kg/m    Wt Readings from Last 3 Encounters:  11/26/22 210 lb 9.6 oz (95.5 kg)  10/10/22 210 lb (95.3 kg)  06/11/22 198 lb (89.8 kg)    Physical Exam Vitals and nursing note reviewed.  Constitutional:      General: He is not in acute distress.    Appearance: Normal appearance. He is well-developed and well-groomed. He is not ill-appearing, toxic-appearing or diaphoretic.  HENT:     Head: Normocephalic and atraumatic.     Jaw: There is normal jaw occlusion.     Right Ear: Hearing normal.     Left Ear: Hearing normal.     Nose: Nose normal.     Mouth/Throat:     Lips: Pink.     Mouth: Mucous membranes are moist.     Pharynx: Uvula midline.  Eyes:     General: Lids are normal.     Conjunctiva/sclera: Conjunctivae normal.     Pupils: Pupils are equal, round, and reactive to light.  Neck:     Trachea: Trachea and phonation normal.  Cardiovascular:     Rate and Rhythm: Normal rate and regular rhythm.     Chest Wall: PMI is not displaced.     Pulses: Normal pulses.     Heart sounds: Normal heart sounds. No murmur heard.    No friction rub. No gallop.  Pulmonary:     Effort: Pulmonary effort is normal. No respiratory distress.     Breath sounds: Normal breath sounds.  Abdominal:     General: There is no abdominal bruit.     Palpations: There is no hepatomegaly or splenomegaly.  Genitourinary:   Musculoskeletal:     Cervical back: Normal range of motion and neck supple.  Skin:    General: Skin is warm and dry.     Capillary Refill: Capillary refill takes less than 2 seconds.     Coloration: Skin is not cyanotic, jaundiced or pale.      Findings: No rash.  Neurological:     General: No focal deficit present.     Mental Status: He is alert and oriented to person, place, and time.     Sensory: Sensation is intact.     Motor: Motor function is intact.     Coordination: Coordination is intact.     Gait: Gait is intact.     Deep Tendon Reflexes: Reflexes are normal and symmetric.  Psychiatric:  Attention and Perception: Attention and perception normal.        Mood and Affect: Mood and affect normal.        Speech: Speech normal.        Behavior: Behavior normal. Behavior is cooperative.        Thought Content: Thought content normal.        Cognition and Memory: Cognition and memory normal.        Judgment: Judgment normal.     Results for orders placed or performed in visit on 10/16/22  Urinalysis, Routine w reflex microscopic  Result Value Ref Range   Specific Gravity, UA 1.020 1.005 - 1.030   pH, UA 7.0 5.0 - 7.5   Color, UA Yellow Yellow   Appearance Ur Clear Clear   Leukocytes,UA Negative Negative   Protein,UA Negative Negative/Trace   Glucose, UA Negative Negative   Ketones, UA Negative Negative   RBC, UA Negative Negative   Bilirubin, UA Negative Negative   Urobilinogen, Ur 0.2 0.2 - 1.0 mg/dL   Nitrite, UA Negative Negative   Microscopic Examination Comment        Pertinent labs & imaging results that were available during my care of the patient were reviewed by me and considered in my medical decision making.  Assessment & Plan:  Jerod was seen today for pilonidal cyst.  Diagnoses and all orders for this visit:  Abscess Not fluctuant so not incised and drained today. Will place on below. Symptomatic care discussed in detail. Report new, worsening, or persistent symptoms.  -     sulfamethoxazole-trimethoprim (BACTRIM DS) 800-160 MG tablet; Take 1 tablet by mouth 2 (two) times daily for 10 days.     Continue all other maintenance medications.  Follow up plan: Return if symptoms worsen  or fail to improve.   Continue healthy lifestyle choices, including diet (rich in fruits, vegetables, and lean proteins, and low in salt and simple carbohydrates) and exercise (at least 30 minutes of moderate physical activity daily).  Educational handout given for skin abscess  The above assessment and management plan was discussed with the patient. The patient verbalized understanding of and has agreed to the management plan. Patient is aware to call the clinic if they develop any new symptoms or if symptoms persist or worsen. Patient is aware when to return to the clinic for a follow-up visit. Patient educated on when it is appropriate to go to the emergency department.   Kari Baars, FNP-C [facility] [phone]

## 2022-11-27 ENCOUNTER — Encounter: Payer: Self-pay | Admitting: Family Medicine

## 2022-11-27 ENCOUNTER — Telehealth: Payer: Self-pay | Admitting: Family Medicine

## 2022-11-27 NOTE — Telephone Encounter (Signed)
Work note revised and available in MyChart. MyChart message sent to patient.  

## 2022-12-18 ENCOUNTER — Ambulatory Visit: Payer: BC Managed Care – PPO | Admitting: Urology

## 2023-01-14 ENCOUNTER — Encounter: Payer: Self-pay | Admitting: Family Medicine

## 2023-01-14 ENCOUNTER — Telehealth: Payer: BC Managed Care – PPO | Admitting: Family Medicine

## 2023-01-14 ENCOUNTER — Other Ambulatory Visit (HOSPITAL_COMMUNITY)
Admission: RE | Admit: 2023-01-14 | Discharge: 2023-01-14 | Disposition: A | Discharge status: Home / Self Care | Payer: BC Managed Care – PPO | Source: Ambulatory Visit | Attending: Family Medicine | Admitting: Family Medicine

## 2023-01-14 ENCOUNTER — Other Ambulatory Visit: Payer: BC Managed Care – PPO

## 2023-01-14 DIAGNOSIS — Z202 Contact with and (suspected) exposure to infections with a predominantly sexual mode of transmission: Secondary | ICD-10-CM

## 2023-01-14 DIAGNOSIS — R300 Dysuria: Secondary | ICD-10-CM

## 2023-01-14 MED ORDER — METRONIDAZOLE 500 MG PO TABS
500.0000 mg | ORAL_TABLET | Freq: Three times a day (TID) | ORAL | 0 refills | Status: AC
Start: 2023-01-14 — End: 2023-01-21

## 2023-01-14 NOTE — Patient Instructions (Signed)
Trichomoniasis Trichomoniasis is a sexually transmitted infection (STI). Many people with trichomoniasis do not have any symptoms (are asymptomatic) or have only minimal symptoms. Untreated trichomoniasis can last from months to years. This condition is treated with medicine. What are the causes? This condition is caused by a parasite called Trichomonas vaginalis and is transmitted during sexual contact. What increases the risk? The following factors may make you more likely to develop this condition: Having unprotected sex. Having sex with a partner who has trichomoniasis. Having multiple sexual partners. Having had previous trichomoniasis infections or other STIs. What are the signs or symptoms? In females, symptoms of trichomoniasis include: Itching, burning, redness, or soreness in the genital area. Discomfort while urinating. Abnormal vaginal discharge that is clear, white, gray, or yellow-green and foamy and has an unusual fishy odor. In males, symptoms of trichomoniasis include: Discharge from the penis. Burning after urination or ejaculation. Itching or discomfort inside the penis. How is this diagnosed? This condition is diagnosed based on tests. To perform a test, your health care provider will do one of the following: Ask you to provide a urine sample. Take a sample of discharge. The sample may be taken from the vagina or cervix in females and from the urethra in males. Your health care provider may use a swab to collect the sample. Your health care provider may test you for other STIs, including human immunodeficiency virus (HIV). How is this treated?  This condition is treated with medicines such as metronidazole or tinidazole. These are called antimicrobial medicines, and they are taken by mouth (orally). Your sexual partner or partners also need to be tested and treated. If they have the infection and are not treated, you will likely get reinfected. If you plan to become  pregnant or think you may be pregnant, tell your health care provider right away. Some medicines that are used to treat the infection should not be taken during pregnancy. Your health care provider may recommend over-the-counter medicines or creams to help relieve itching or irritation. You may be tested for the infection again 3 months after treatment. Follow these instructions at home: Medicines Take over-the-counter and prescription medicines only as told by your health care provider. Take your antimicrobial medicine as told by your health care provider. Do not stop taking it even if you start to feel better. Use creams as told by your health care provider. General instructions Do not have sex until after you finish your medicine and your symptoms have resolved. Do not wear tampons while you have the infection (if you are male). Talk with your sexual partner or partners about any symptoms that either of you may have, as well as any history of STIs. Keep all follow-up visits. This is important. How is this prevented?  Use condoms every time you have sex. Using condoms correctly and consistently can help protect against STIs. Do not have sexual contact if you have symptoms of trichomoniasis or another STI. Avoid having multiple sexual partners. Get tested for STIs before you have sex with a partner. Ask all partners to do the same. Do not douche (if you are male). Douching may increase your risk for getting STIs due to the removal of good bacteria in the vagina. Contact a health care provider if: You still have symptoms after you finish your medicine. You develop a rash. You plan to become pregnant or think you may be pregnant. Summary Trichomoniasis is a sexually transmitted infection (STI). This condition often has no symptoms or minimal   symptoms. Take your antimicrobial medicine as told by your health care provider. Do not stop taking even if you start to feel better. Discuss your  infection with your sexual partner or partners. Make sure that all partners get tested and treated, if necessary. You should not have sex until after you finish your medicine and your symptoms have resolved. Keep all follow-up visits. This is important. This information is not intended to replace advice given to you by your health care provider. Make sure you discuss any questions you have with your health care provider. Document Revised: 05/24/2021 Document Reviewed: 05/24/2021 Elsevier Patient Education  2024 Elsevier Inc.  

## 2023-01-14 NOTE — Progress Notes (Signed)
   Virtual Visit via video Note   Due to COVID-19 pandemic this visit was conducted virtually. This visit type was conducted due to national recommendations for restrictions regarding the COVID-19 Pandemic (e.g. social distancing, sheltering in place) in an effort to limit this patient's exposure and mitigate transmission in our community. All issues noted in this document were discussed and addressed.  A physical exam was not performed with this format.  I connected with  [name]  on 01/14/23 at 1259 by video and verified that I am speaking with the correct person using two identifiers. Henry Henry is currently located outside and no one is currently with him during the visit. The provider, Gabriel Earing, FNP is located in their office at time of visit.  I discussed the limitations, risks, security and privacy concerns of performing an evaluation and management service by video  and the availability of in person appointments. I also discussed with the patient that there may be a patient responsible charge related to this service. The patient expressed understanding and agreed to proceed.  CC: STD exposure  History and Present Illness:  Henry Henry reports that his sexual partner has tested positive for trich in the last week. She is currently being treated for this. She reported to him that she tested negative for all other STDs. He has been having some mild dysuria for the last 2-3 days but denies other symtpoms. He has not been sexually active with any other partners.   ROS As per HPI.     Observations/Objective: Alert and oriented. Respirations unlabored. No cyanosis. Non toxic appearing. Normal mood and behavior.    Assessment and Plan: Henry Henry was seen today for exposure to std.  Diagnoses and all orders for this visit:  Exposure to trichomonas Flagyl as below given exposure. He will stop by the leave urine for testing as below prior to starting treatment. Discussed  no sexual activity until both have completed treatment and are symptomatic. Also discussed test of cure in 3 weeks if positive.  -     metroNIDAZOLE (FLAGYL) 500 MG tablet; Take 1 tablet (500 mg total) by mouth 3 (three) times daily for 7 days. -     Urine cytology ancillary only; Future  Dysuria -     Urine cytology ancillary only; Future   Follow Up Instructions: Return to office for new or worsening symptoms, or if symptoms persist.     I discussed the assessment and treatment plan with the patient. The patient was provided an opportunity to ask questions and all were answered. The patient agreed with the plan and demonstrated an understanding of the instructions.   The patient was advised to call back or seek an in-person evaluation if the symptoms worsen or if the condition fails to improve as anticipated.  The above assessment and management plan was discussed with the patient. The patient verbalized understanding of and has agreed to the management plan. Patient is aware to call the clinic if symptoms persist or worsen. Patient is aware when to return to the clinic for a follow-up visit. Patient educated on when it is appropriate to go to the emergency department.   Time call ended: 1305  I provided 6 minutes of face-to-face time during this encounter.    Gabriel Earing, FNP

## 2023-01-16 LAB — URINE CYTOLOGY ANCILLARY ONLY
Chlamydia: NEGATIVE
Comment: NEGATIVE
Comment: NEGATIVE
Comment: NORMAL
Neisseria Gonorrhea: NEGATIVE
Trichomonas: NEGATIVE

## 2023-01-24 ENCOUNTER — Encounter: Payer: Self-pay | Admitting: Family Medicine

## 2023-01-24 ENCOUNTER — Ambulatory Visit: Payer: BC Managed Care – PPO | Admitting: Family Medicine

## 2023-01-24 VITALS — BP 105/61 | HR 63 | Temp 97.6°F | Ht 69.0 in | Wt 219.6 lb

## 2023-01-24 DIAGNOSIS — H1089 Other conjunctivitis: Secondary | ICD-10-CM

## 2023-01-24 DIAGNOSIS — H109 Unspecified conjunctivitis: Secondary | ICD-10-CM

## 2023-01-24 MED ORDER — TOBRAMYCIN-DEXAMETHASONE 0.3-0.1 % OP SUSP
OPHTHALMIC | 0 refills | Status: DC
Start: 2023-01-24 — End: 2023-02-22

## 2023-01-24 NOTE — Progress Notes (Unsigned)
Chief Complaint  Patient presents with   Conjunctivitis    HPI  Patient presents today for left eye drainage since yesterday. Goopy DC noted. Glued shut this AM. Vision unaffected.   PMH: Smoking status noted ROS: Per HPI  Objective: BP 105/61   Pulse 63   Temp 97.6 F (36.4 C)   Ht 5\' 9"  (1.753 m)   Wt 219 lb 9.6 oz (99.6 kg)   SpO2 97%   BMI 32.43 kg/m  Gen: NAD, alert, cooperative with exam HEENT: NCAT, EOMI, PERRL. Left conj injected Neuro: Alert and oriented, No gross deficits  Assessment and plan:  1. Bacterial conjunctivitis of left eye     Meds ordered this encounter  Medications   tobramycin-dexamethasone (TOBRADEX) ophthalmic solution    Sig: Apply 1 drop in affected eye(s) every 2 hours for two days. Then every 4 hours for 5 days.    Dispense:  5 mL    Refill:  0    No orders of the defined types were placed in this encounter.   Follow up as needed.  Mechele Claude, MD

## 2023-01-26 ENCOUNTER — Encounter: Payer: Self-pay | Admitting: Family Medicine

## 2023-02-22 ENCOUNTER — Encounter: Payer: Self-pay | Admitting: Nurse Practitioner

## 2023-02-22 ENCOUNTER — Ambulatory Visit: Payer: BC Managed Care – PPO | Admitting: Nurse Practitioner

## 2023-02-22 ENCOUNTER — Ambulatory Visit (INDEPENDENT_AMBULATORY_CARE_PROVIDER_SITE_OTHER): Payer: BC Managed Care – PPO

## 2023-02-22 VITALS — BP 107/76 | HR 53 | Temp 97.7°F | Resp 20 | Ht 69.0 in | Wt 209.0 lb

## 2023-02-22 DIAGNOSIS — M25561 Pain in right knee: Secondary | ICD-10-CM | POA: Diagnosis not present

## 2023-02-22 DIAGNOSIS — R1031 Right lower quadrant pain: Secondary | ICD-10-CM

## 2023-02-22 DIAGNOSIS — M25551 Pain in right hip: Secondary | ICD-10-CM

## 2023-02-22 MED ORDER — NAPROXEN 500 MG PO TABS
500.0000 mg | ORAL_TABLET | Freq: Two times a day (BID) | ORAL | 1 refills | Status: DC
Start: 2023-02-22 — End: 2023-02-27

## 2023-02-22 NOTE — Patient Instructions (Signed)
Acute Knee Pain, Adult Many things can cause knee pain. Sometimes, knee pain is sudden (acute) and may be caused by damage, swelling, or irritation of the muscles and tissues that support your knee. The pain often goes away on its own with time and rest. If the pain does not go away, tests may be done to find out what is causing the pain. Follow these instructions at home: If you have a knee sleeve or brace:  Wear the knee sleeve or brace as told by your doctor. Take it off only as told by your doctor. Loosen it if your toes: Tingle. Become numb. Turn cold and blue. Keep it clean. If the knee sleeve or brace is not waterproof: Do not let it get wet. Cover it with a watertight covering when you take a bath or shower. Activity Rest your knee. Do not do things that cause pain or make pain worse. Avoid activities where both feet leave the ground at the same time (high-impact activities). Examples are running, jumping rope, and doing jumping jacks. Work with a physical therapist to make a safe exercise program, as told by your doctor. Managing pain, stiffness, and swelling  If told, put ice on the knee. To do this: If you have a removable knee sleeve or brace, take it off as told by your doctor. Put ice in a plastic bag. Place a towel between your skin and the bag. Leave the ice on for 20 minutes, 2-3 times a day. Take off the ice if your skin turns bright red. This is very important. If you cannot feel pain, heat, or cold, you have a greater risk of damage to the area. If told, use an elastic bandage to put pressure (compression) on your injured knee. Raise your knee above the level of your heart while you are sitting or lying down. Sleep with a pillow under your knee. General instructions Take over-the-counter and prescription medicines only as told by your doctor. Do not smoke or use any products that contain nicotine or tobacco. If you need help quitting, ask your doctor. If you are  overweight, work with your doctor and a food expert (dietitian) to set goals to lose weight. Being overweight can make your knee hurt more. Watch for any changes in your symptoms. Keep all follow-up visits. Contact a doctor if: The knee pain does not stop. The knee pain changes or gets worse. You have a fever along with knee pain. Your knee is red or feels warm when you touch it. Your knee gives out or locks up. Get help right away if: Your knee swells, and the swelling gets worse. You cannot move your knee. You have very bad knee pain that does not get better with pain medicine. Summary Many things can cause knee pain. The pain often goes away on its own with time and rest. Your doctor may do tests to find out the cause of the pain. Watch for any changes in your symptoms. Relieve your pain with rest, medicines, light activity, and use of ice. Get help right away if you cannot move your knee or your knee pain is very bad. This information is not intended to replace advice given to you by your health care provider. Make sure you discuss any questions you have with your health care provider. Document Revised: 12/08/2019 Document Reviewed: 12/09/2019 Elsevier Patient Education  2024 Elsevier Inc.  

## 2023-02-22 NOTE — Progress Notes (Signed)
   Subjective:    Patient ID: Henry Henry, male    DOB: [birth year], 32 y.o.   MRN: [id number]   Chief Complaint: fall  Knee Pain     Patient come sin  today stating that he fell down his steps at home this morning. Injured his right knee . Rates pain 8/10. Worse when bearing weight. Slight right hip and groin pain as well. Rates 4/10 currently.    Patient Active Problem List   Diagnosis Date Noted   Anxiety 06/11/2022   Depression, major, single episode, moderate (HCC) 06/11/2022       Review of Systems  Musculoskeletal:  Positive for arthralgias (right knee) and gait problem.       Objective:   Physical Exam Constitutional:      Appearance: Normal appearance.  Musculoskeletal:     Comments: No right knee effusion- FROM with pain on flexion and extension- all ligaments appear intact- limping on right leg when walking FROM of right hip without pain   Lymphadenopathy:     Cervical: Cervical adenopathy present.  Neurological:     Mental Status: He is alert.       Right knee xray- normal- no visible fractures-Preliminary reading by Paulene Floor, FNP  [facility]     Assessment & Plan:   [name] in today with chief complaint of Knee Pain and Right groin pain (Fell down several steps this morning/)   1. Acute pain of right knee Knee wrap Ice BID Rest RTO prn - DG Knee 1-2 Views Right - naproxen (NAPROSYN) 500 MG tablet; Take 1 tablet (500 mg total) by mouth 2 (two) times daily with a meal.  Dispense: 60 tablet; Refill: 1  2. Right hip pain - naproxen (NAPROSYN) 500 MG tablet; Take 1 tablet (500 mg total) by mouth 2 (two) times daily with a meal.  Dispense: 60 tablet; Refill: 1  3. Groin pain, right - naproxen (NAPROSYN) 500 MG tablet; Take 1 tablet (500 mg total) by mouth 2 (two) times daily with a meal.  Dispense: 60 tablet; Refill: 1    The above assessment and management plan was discussed with the patient. The patient verbalized understanding of  and has agreed to the management plan. Patient is aware to call the clinic if symptoms persist or worsen. Patient is aware when to return to the clinic for a follow-up visit. Patient educated on when it is appropriate to go to the emergency department.   Mary-Margaret Daphine Deutscher, FNP

## 2023-02-27 ENCOUNTER — Encounter: Payer: Self-pay | Admitting: Family Medicine

## 2023-02-27 ENCOUNTER — Ambulatory Visit: Payer: BC Managed Care – PPO | Admitting: Family Medicine

## 2023-02-27 VITALS — BP 113/68 | HR 74 | Temp 97.9°F | Ht 69.0 in | Wt 217.0 lb

## 2023-02-27 DIAGNOSIS — W108XXA Fall (on) (from) other stairs and steps, initial encounter: Secondary | ICD-10-CM

## 2023-02-27 DIAGNOSIS — S83411A Sprain of medial collateral ligament of right knee, initial encounter: Secondary | ICD-10-CM

## 2023-02-27 MED ORDER — TRAMADOL HCL 50 MG PO TABS: 50.0000 mg | ORAL_TABLET | Freq: Four times a day (QID) | ORAL | 0 refills | Status: AC

## 2023-02-27 MED ORDER — DICLOFENAC SODIUM 75 MG PO TBEC: 75.0000 mg | DELAYED_RELEASE_TABLET | Freq: Two times a day (BID) | ORAL | 2 refills | Status: DC

## 2023-02-27 NOTE — Progress Notes (Signed)
Chief Complaint  Patient presents with   Knee Injury    Right knee, 8/16    HPI  Patient presents today for Continued pain in the right knee after falling at home down stairs. He had XR, given naproxyn here 5 days ago. Now states that the knee is worse. 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   PMH: Smoking status noted ROS: Review of Systems  Constitutional:  Positive for activity change and fatigue. Negative for appetite change and fever.  HENT: Negative.         Denies head injury   Respiratory: Negative.    Genitourinary:  Negative for difficulty urinating.  Neurological:  Positive for weakness (localized to RLE).     Objective: BP 113/68   Pulse 74   Temp 97.9 F (36.6 C)   Ht 5\' 9"  (1.753 m)   Wt 217 lb (98.4 kg)   SpO2 97%   BMI 32.05 kg/m  Gen: NAD, alert, cooperative with exam HEENT: NCAT, EOMI, PERRL Ht: rrr no murmur Lungs: CTA Ext: No edema, warm. Tender at hamstring tendons, at superior patella.McMurray positive for medial meniscus. Stress maneuver positive for right medial collateral ligament pain and 10 degree opening.  Neuro: Alert and oriented, No gross deficits. NV intact RLE  XR reviewed - no fracture noted  Assessment and plan:  1. Sprain of medial collateral ligament of right knee, initial encounter   2. Fall (on) (from) other stairs and steps, initial encounter     Meds ordered this encounter  Medications   traMADol (ULTRAM) 50 MG tablet    Sig: Take 1 tablet (50 mg total) by mouth 4 (four) times daily for 5 days. 1-2 tablets up to 4 times a day as needed for pain    Dispense:  20 tablet    Refill:  0   diclofenac (VOLTAREN) 75 MG EC tablet    Sig: Take 1 tablet (75 mg total) by mouth 2 (two) times daily. For muscle and  Joint pain    Dispense:  60 tablet    Refill:  2    Orders Placed This Encounter  Procedures   Ambulatory referral to Physical Therapy    Referral Priority:   Urgent     Referral Type:   Physical Medicine    Referral Reason:   Specialty Services Required    Requested Specialty:   Physical Therapy    Number of Visits Requested:   1   Ambulatory referral to Orthopedics    Referral Priority:   Urgent    Referral Type:   Consultation    Referral Reason:   Specialty Services Required    Number of Visits Requested:   1    Follow up as needed.  Mechele Claude, MD

## 2023-03-04 ENCOUNTER — Ambulatory Visit: Payer: BC Managed Care – PPO | Admitting: Surgical

## 2023-03-06 ENCOUNTER — Ambulatory Visit: Payer: BC Managed Care – PPO | Attending: Family Medicine

## 2023-03-20 ENCOUNTER — Ambulatory Visit: Payer: BC Managed Care – PPO | Admitting: Family Medicine

## 2023-04-03 ENCOUNTER — Ambulatory Visit (INDEPENDENT_AMBULATORY_CARE_PROVIDER_SITE_OTHER): Payer: BC Managed Care – PPO

## 2023-04-03 ENCOUNTER — Ambulatory Visit: Payer: BC Managed Care – PPO | Admitting: Family Medicine

## 2023-04-03 VITALS — BP 96/63 | HR 93 | Temp 98.8°F | Ht 69.0 in | Wt 217.2 lb

## 2023-04-03 DIAGNOSIS — R0602 Shortness of breath: Secondary | ICD-10-CM

## 2023-04-03 DIAGNOSIS — J069 Acute upper respiratory infection, unspecified: Secondary | ICD-10-CM

## 2023-04-03 DIAGNOSIS — R509 Fever, unspecified: Secondary | ICD-10-CM | POA: Diagnosis not present

## 2023-04-03 MED ORDER — PREDNISONE 20 MG PO TABS
40.0000 mg | ORAL_TABLET | Freq: Every day | ORAL | 0 refills | Status: AC
Start: 2023-04-03 — End: 2023-04-08

## 2023-04-03 MED ORDER — DOXYCYCLINE HYCLATE 100 MG PO TABS
100.0000 mg | ORAL_TABLET | Freq: Two times a day (BID) | ORAL | 0 refills | Status: AC
Start: 2023-04-03 — End: 2023-04-10

## 2023-04-03 NOTE — Progress Notes (Signed)
Acute Office Visit  Subjective:     Patient ID: Henry Henry, male    DOB: Oct 28, 1990, 32 y.o.   MRN: [id number]  Chief Complaint  Patient presents with   Nasal Congestion   Fever   Shortness of Breath    Shortness of Breath This is a new problem. The current episode started in the past 7 days. The problem has been gradually worsening. Associated symptoms include chest pain (tightness with breathing), a fever (off and on for 5 days, max temp of 101.4), headaches and wheezing. Pertinent negatives include no abdominal pain, ear pain, hemoptysis, rhinorrhea, sore throat, sputum production or vomiting. Associated symptoms comments: Shortness of breath, wheezing for last 1-2 days. Treatments tried: advil, dayquil. The treatment provided mild relief. There is no history of asthma, bronchiolitis, CAD, chronic lung disease, COPD, DVT, a heart failure, PE or pneumonia.    Review of Systems  Constitutional:  Positive for fever (off and on for 5 days, max temp of 101.4).  HENT:  Negative for ear pain, rhinorrhea and sore throat.   Respiratory:  Positive for wheezing. Negative for hemoptysis and sputum production.   Cardiovascular:  Positive for chest pain (tightness with breathing).  Gastrointestinal:  Negative for abdominal pain and vomiting.  Neurological:  Positive for headaches.        Objective:    BP 96/63   Pulse 93   Temp 98.8 F (37.1 C) (Oral)   Ht 5\' 9"  (1.753 m)   Wt 217 lb 4 oz (98.5 kg)   SpO2 96%   BMI 32.08 kg/m    Physical Exam Vitals and nursing note reviewed.  Constitutional:      General: He is not in acute distress.    Appearance: He is obese. He is ill-appearing. He is not toxic-appearing or diaphoretic.  HENT:     Right Ear: Tympanic membrane, ear canal and external ear normal.     Left Ear: Tympanic membrane, ear canal and external ear normal.     Nose: Congestion present.     Mouth/Throat:     Mouth: Mucous membranes are moist.     Pharynx:  Oropharynx is clear. No oropharyngeal exudate or posterior oropharyngeal erythema.  Eyes:     General:        Right eye: No discharge.        Left eye: No discharge.     Conjunctiva/sclera: Conjunctivae normal.  Cardiovascular:     Rate and Rhythm: Normal rate and regular rhythm.     Heart sounds: Normal heart sounds. No murmur heard. Pulmonary:     Effort: Pulmonary effort is normal. No respiratory distress.     Breath sounds: Normal breath sounds. No wheezing, rhonchi or rales.  Musculoskeletal:     Cervical back: Neck supple. No rigidity.     Right lower leg: No edema.     Left lower leg: No edema.  Lymphadenopathy:     Cervical: No cervical adenopathy.  Skin:    General: Skin is warm and dry.  Neurological:     General: No focal deficit present.     Mental Status: He is alert and oriented to person, place, and time.  Psychiatric:        Mood and Affect: Mood normal.        Behavior: Behavior normal.     No results found for any visits on 04/03/23.      Assessment & Plan:   [name] was seen today for nasal congestion, fever  and shortness of breath.  Diagnoses and all orders for this visit:  URI, acute Fever, unspecified fever cause Shortness of breath Will treat empirically for CAP with doxycyline and prednisone. CXR report pending. Discussed symptomatic care and return precautions.  -     DG Chest 2 View; Future -     predniSONE (DELTASONE) 20 MG tablet; Take 2 tablets (40 mg total) by mouth daily with breakfast for 5 days. -     doxycycline (VIBRA-TABS) 100 MG tablet; Take 1 tablet (100 mg total) by mouth 2 (two) times daily for 7 days.  Return if symptoms worsen or fail to improve.  The patient indicates understanding of these issues and agrees with the plan.  Gabriel Earing, FNP

## 2023-04-08 ENCOUNTER — Encounter: Payer: Self-pay | Admitting: Family Medicine

## 2023-04-08 ENCOUNTER — Telehealth: Payer: Self-pay | Admitting: Family Medicine

## 2023-04-08 ENCOUNTER — Ambulatory Visit: Payer: BC Managed Care – PPO | Admitting: Family Medicine

## 2023-04-08 VITALS — BP 103/65 | HR 82 | Temp 97.1°F | Ht 69.0 in | Wt 219.5 lb

## 2023-04-08 DIAGNOSIS — Z202 Contact with and (suspected) exposure to infections with a predominantly sexual mode of transmission: Secondary | ICD-10-CM

## 2023-04-08 DIAGNOSIS — Z23 Encounter for immunization: Secondary | ICD-10-CM

## 2023-04-08 NOTE — Progress Notes (Signed)
   Acute Office Visit  Subjective:     Patient ID: Henry Henry, male    DOB: May 04, 1991, 32 y.o.   MRN: [id number]  Chief Complaint  Patient presents with   Exposure to STD    Exposure to STD   Patient is in today for exposure to STD. He was notified on 04/05/23 that his partner had tested positive for chlamydia. He reports sexual contact with this person was 2-3 weeks ago. He denies any symptoms and he currently is completing a 7 day course of doxycyline for a respiratory infection. He is interested in labs today for STD screening as well.   ROS As per HPI.     Objective:    BP 103/65   Pulse 82   Temp (!) 97.1 F (36.2 C) (Temporal)   Ht 5\' 9"  (1.753 m)   Wt 219 lb 8 oz (99.6 kg)   SpO2 97%   BMI 32.41 kg/m    Physical Exam Vitals and nursing note reviewed.  Constitutional:      General: He is not in acute distress.    Appearance: Normal appearance. He is not ill-appearing, toxic-appearing or diaphoretic.  Pulmonary:     Effort: Pulmonary effort is normal. No respiratory distress.  Abdominal:     General: Bowel sounds are normal. There is no distension.     Palpations: Abdomen is soft.     Tenderness: There is no abdominal tenderness. There is no guarding or rebound.  Musculoskeletal:     Cervical back: Neck supple. No rigidity.     Right lower leg: No edema.     Left lower leg: No edema.  Skin:    General: Skin is warm and dry.  Neurological:     General: No focal deficit present.     Mental Status: He is alert and oriented to person, place, and time.  Psychiatric:        Mood and Affect: Mood normal.        Behavior: Behavior normal.     No results found for any visits on 04/08/23.      Assessment & Plan:   Henry Henry was seen today for exposure to std.  Diagnoses and all orders for this visit:  Exposure to STD Complete doxycyline as prescribed. Testing pending as below. No sexual activity until testing results.  -     Ct Ng M genitalium NAA,  Urine -     HepB+HepC+HIV Panel -     RPR  Need for immunization against influenza Flu vaccine today.   Return to office for new or worsening symptoms, or if symptoms persist.   The patient indicates understanding of these issues and agrees with the plan.  Gabriel Earing, FNP

## 2023-04-08 NOTE — Telephone Encounter (Signed)
Pt states his partner recently tested positive for Chly so advised pt he ntbs and tested. Scheduled pt for today at 1:50.

## 2023-04-09 LAB — RPR: RPR Ser Ql: NONREACTIVE

## 2023-04-09 LAB — HEPB+HEPC+HIV PANEL
HIV Screen 4th Generation wRfx: NONREACTIVE
Hep B C IgM: NEGATIVE
Hep B Core Total Ab: NEGATIVE
Hep B E Ab: NONREACTIVE
Hep B E Ag: NEGATIVE
Hep B Surface Ab, Qual: NONREACTIVE
Hep C Virus Ab: NONREACTIVE
Hepatitis B Surface Ag: NEGATIVE

## 2023-04-10 LAB — CT NG M GENITALIUM NAA, URINE
Chlamydia trachomatis, NAA: NEGATIVE
Mycoplasma genitalium NAA: NEGATIVE
Neisseria gonorrhoeae, NAA: NEGATIVE

## 2023-04-17 ENCOUNTER — Ambulatory Visit (INDEPENDENT_AMBULATORY_CARE_PROVIDER_SITE_OTHER): Payer: BC Managed Care – PPO

## 2023-04-17 DIAGNOSIS — Z23 Encounter for immunization: Secondary | ICD-10-CM

## 2023-05-02 ENCOUNTER — Ambulatory Visit: Payer: BC Managed Care – PPO | Admitting: Family Medicine

## 2023-05-02 ENCOUNTER — Encounter: Payer: Self-pay | Admitting: Family Medicine

## 2023-05-17 ENCOUNTER — Ambulatory Visit: Payer: BC Managed Care – PPO

## 2023-07-22 ENCOUNTER — Ambulatory Visit: Payer: Self-pay

## 2023-07-22 NOTE — Telephone Encounter (Signed)
  Chief Complaint: Shoulder injury Symptoms: Shoulder pain, decreased ROM Frequency: Since fall about 2 hours ago Pertinent Negatives: Patient denies numbness, tingling, open wound, bruising Disposition: [x] ED /[] Urgent Care (no appt availability in office) / [] Appointment(In office/virtual)/ []  [facility]/ [] Home Care/ [] Refused Recommended Disposition /[] [facility]/ []  Follow-up with PCP Additional Notes: Pt reports he slipped and fell on some ice about two hours ago at work and clarifies this will be a WC visit. He reports he tried to stop himself with his left arm and landed on the L shoulder. Pt reports he had a previous labrum repair in that shoulder. He denies numbness, tingling or obvious swelling although he states it feels like it could be a little swollen. Pt notes pain at 7/10 with movement and notes it feels like something is catching when he moves it. This RN advised pt to be seen in ED now for eval/treat. Pt verbalized understanding and agrees to plan.   Copied from CRM [id number]. Topic: Clinical - Pink Word Triage >> Jul 22, 2023  2:18 PM Delon T wrote: Reason for Triage: Clemens on ice and landed on arm he had surgery to correct labrum, can move but is very painful, please call patient [phone] Reason for Disposition  Sounds like a serious injury to the triager  Answer Assessment - Initial Assessment Questions 1. MECHANISM: How did the injury happen?     Fall at work 2. ONSET: When did the injury happen? (Minutes or hours ago)      About 2 hours ago 3. APPEARANCE of INJURY: What does the injury look like?      Feels mildly swollen 4. SEVERITY: Can you move the shoulder normally?      Limited ROM, pain 5. SIZE: For cuts, bruises, or swelling, ask: How large is it? (e.g., inches or centimeters;  entire joint)      None 6. PAIN: Is there pain? If Yes, ask: How bad is the pain?   (e.g., Scale 1-10; or mild, moderate, severe)   -  MILD (1-3): doesn't interfere with normal activities   - MODERATE (4-7): interferes with normal activities (e.g., work or school) or awakens from sleep   - SEVERE (8-10): excruciating pain, unable to do any normal activities, unable to move arm at all due to pain     7/10 with movement 8. OTHER SYMPTOMS: Do you have any other symptoms? (e.g., loss of sensation)     None  Answer Assessment - Initial Assessment Questions 1. MECHANISM: How did the injury happen?      Slipped on ice at work 2. WHEN: When did the injury happen? (Minutes or hours ago)      About 2 hours ago 3. LOCATION: Which shoulder is injured?     Left shoulder 4. APPEARANCE of INJURY: What does the injury look like?      No apparent injury, feels a little swollen 5. SEVERITY: Can your child move the shoulder normally? (Can reach up to the sky, out to the side)      Loss of mobility, pain with movement 6. SIZE: For bruises or swelling, ask: How large is it? (Inches or centimeters)      Unknown 7. PAIN: Is there pain? If so, ask: How bad is the pain?      7/10 with movement and ROM  Protocols used: Shoulder Injury-P-AH, Shoulder Injury-A-AH

## 2023-07-23 ENCOUNTER — Encounter (HOSPITAL_COMMUNITY): Payer: Self-pay

## 2023-07-23 ENCOUNTER — Emergency Department (HOSPITAL_COMMUNITY)
Admission: EM | Admit: 2023-07-23 | Discharge: 2023-07-23 | Disposition: A | Discharge status: Home / Self Care | Payer: Worker's Compensation | Attending: Emergency Medicine | Admitting: Emergency Medicine

## 2023-07-23 ENCOUNTER — Emergency Department (HOSPITAL_COMMUNITY): Payer: Worker's Compensation

## 2023-07-23 ENCOUNTER — Other Ambulatory Visit: Payer: Self-pay

## 2023-07-23 DIAGNOSIS — S42142A Displaced fracture of glenoid cavity of scapula, left shoulder, initial encounter for closed fracture: Secondary | ICD-10-CM | POA: Insufficient documentation

## 2023-07-23 DIAGNOSIS — S3141XA Laceration without foreign body of vagina and vulva, initial encounter: Secondary | ICD-10-CM

## 2023-07-23 DIAGNOSIS — M25512 Pain in left shoulder: Secondary | ICD-10-CM | POA: Diagnosis not present

## 2023-07-23 DIAGNOSIS — W108XXA Fall (on) (from) other stairs and steps, initial encounter: Secondary | ICD-10-CM | POA: Diagnosis not present

## 2023-07-23 DIAGNOSIS — S4992XA Unspecified injury of left shoulder and upper arm, initial encounter: Secondary | ICD-10-CM | POA: Diagnosis not present

## 2023-07-23 DIAGNOSIS — S4982XA Other specified injuries of left shoulder and upper arm, initial encounter: Secondary | ICD-10-CM | POA: Diagnosis not present

## 2023-07-23 MED ORDER — HYDROCODONE-ACETAMINOPHEN 5-325 MG PO TABS: ORAL_TABLET | ORAL | 0 refills | Status: AC

## 2023-07-23 MED ORDER — IBUPROFEN 800 MG PO TABS: 800.0000 mg | ORAL_TABLET | Freq: Three times a day (TID) | ORAL | 0 refills | Status: AC

## 2023-07-23 NOTE — Discharge Instructions (Addendum)
 Keep your left arm placed in the sling.  You may apply ice packs on and off to help with discomfort.  Please follow-up with the recommended orthopedic provider.

## 2023-07-23 NOTE — ED Triage Notes (Signed)
 Pt arrived via POV from home c/o left shoulder pain from an injury. Pt reports he slipped on black ice yesterday, Pt denies hitting head or LOC. Pt reports previous surgery to left shoulder apprx 3 years ago. Pt reports loss of strength and mobility in LUE

## 2023-07-23 NOTE — ED Provider Notes (Signed)
 [place] EMERGENCY DEPARTMENT AT [facility] Provider Note   CSN: [id number] Arrival date & time: 07/23/23  1015     History  Chief Complaint  Patient presents with   Shoulder Injury    ELSTER CORBELLO is a 33 y.o. male.   Shoulder Injury Pertinent negatives include no chest pain, no abdominal pain, no headaches and no shortness of breath.       TRYSON LUMLEY is a 33 y.o. male who presents to the Emergency Department complaining of sudden onset of left shoulder pain after mechanical fall that occurred yesterday.  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  Patient had surgical repair of a labrum tear in 2022.  States he slipped on some stairs struck the back of his shoulder on the stairs.  States his left arm was extended as he tried to catch himself as he fell.  He is unable to abduct the arm without significant pain.  He describes having some tingling sensations of his fingers to the left hand.  With mild weakness of his grip.  No head injury or LOC.  Home Medications Prior to Admission medications   Not on File      Allergies    Patient has no known allergies.    Review of Systems   Review of Systems  Constitutional:  Negative for fever.  Respiratory:  Negative for shortness of breath.   Cardiovascular:  Negative for chest pain.  Gastrointestinal:  Negative for abdominal pain and vomiting.  Musculoskeletal:  Positive for arthralgias (left shoulder pain). Negative for back pain.  Skin:  Negative for wound.  Neurological:  Negative for dizziness, syncope, weakness and headaches.    Physical Exam Updated Vital Signs BP 112/84 (BP Location: Right Arm)   Pulse 81   Temp 98.7 F (37.1 C) (Oral)   Resp 18   Ht 5' 9 (1.753 m)   Wt 100 kg   SpO2 99%   BMI 32.56 kg/m  Physical Exam Vitals and nursing note reviewed.  Constitutional:      General: He is not in acute distress.    Appearance: Normal appearance. He is not ill-appearing or toxic-appearing.   HENT:     Head: Atraumatic.  Cardiovascular:     Rate and Rhythm: Normal rate and regular rhythm.     Pulses: Normal pulses.  Pulmonary:     Effort: Pulmonary effort is normal.  Musculoskeletal:        General: Tenderness and signs of injury present. No swelling or deformity.     Left shoulder: Tenderness present. No swelling, deformity or crepitus. Decreased range of motion. Decreased strength. Normal pulse.     Cervical back: Normal range of motion.     Comments: Tenderness to palpation along the anterior left shoulder.  I do not appreciate any bony step-offs.  No edema.  Pain with attempted abduction.  Left elbow and wrist are nontender  Skin:    General: Skin is warm.     Capillary Refill: Capillary refill takes less than 2 seconds.     Findings: No bruising.  Neurological:     General: No focal deficit present.     Mental Status: He is alert.     Sensory: No sensory deficit.     Motor: No weakness.     ED Results / Procedures / Treatments   Labs (all labs ordered are listed, but only abnormal results are displayed) Labs Reviewed - No data to display  EKG None  Radiology  MR SHOULDER LEFT WO CONTRAST Result Date: 07/23/2023 CLINICAL DATA:  Shoulder trauma, labral tear suspected, xray done History of shoulder surgery. EXAM: MRI OF THE LEFT SHOULDER WITHOUT CONTRAST TECHNIQUE: Multiplanar, multisequence MR imaging of the shoulder was performed. No intravenous contrast was administered. COMPARISON:  Radiographs 07/23/2023 FINDINGS: Rotator cuff:  Intact without significant tendinosis. Muscles:  No focal rotator cuff muscular atrophy or edema. Biceps long head:  Intact and normally positioned. Acromioclavicular Joint: The acromion is type 1. The acromioclavicular joint appears normal. No significant fluid is present in the subacromial - subdeltoid bursa. Glenohumeral Joint: No significant shoulder joint effusion or glenohumeral arthropathy. Labrum: Labral assessment limited by the  lack of joint fluid.However, there is evidence of a nondisplaced posterior labral tear associated with a multilobulated paralabral cyst in the spinoglenoid notch. This has an individual component measuring up to 1.2 cm on image 12/6. Overall greatest extent of the cyst is 3.2 cm on axial image 14/5. No resulting osseous erosion or muscular denervation identified. Bones: No evidence of acute fracture, dislocation or osteonecrosis.Intraosseous cyst formation centrally in the glenoid, likely postsurgical in etiology. Other: No significant soft tissue findings. IMPRESSION: 1. Nondisplaced posterior labral tear associated with a multilobulated paralabral cyst in the spinoglenoid notch. No resulting osseous erosion or muscular denervation identified. 2. The rotator cuff and biceps tendon are intact. 3. No acute osseous findings. Probable postsurgical changes in the glenoid; correlate with prior surgical history. Electronically Signed   By: Elsie Perone M.D.   On: 07/23/2023 12:26   DG Shoulder Left Result Date: 07/23/2023 CLINICAL DATA:  Injury.  Left shoulder pain. EXAM: LEFT SHOULDER - 2+ VIEW COMPARISON:  None Available. FINDINGS: There is cortical irregularity and step-off of the glenoid of the scapula, highly concerning for underlying fracture. Further evaluation with cross-sectional imaging is recommended. No other acute fracture or dislocation. No aggressive osseous lesion. Glenohumeral and acromioclavicular joints are normal in alignment and exhibit no significant degenerative changes. No soft tissue swelling. No radiopaque foreign bodies. IMPRESSION: *Mildly displaced left glenoid fracture. Further evaluation with cross-sectional imaging is recommended. Electronically Signed   By: Ree Molt M.D.   On: 07/23/2023 11:29    Procedures Procedures    Medications Ordered in ED Medications - No data to display  ED Course/ Medical Decision Making/ A&P                                 Medical  Decision Making Patient here for evaluation of left shoulder pain after mechanical fall.  Slipped on some stairs landed on his left shoulder with left arm extended as he was trying to catch himself to prevent a fall.  Patient is status post surgical repair of torn labrum 2022 current pain feels similar.  Patient has pain anterior left shoulder on my exam.  I do not appreciate any bony step-offs, he has limited range of motion due to pain.  Some mild paresthesias reported to the fingers of the left hand.  Fracture, dislocation, recurrent labral tear, rotator cuff tear all considered as well as strain.  Amount and/or Complexity of Data Reviewed Radiology: ordered.    Details: X-ray of the left shoulder shows mildly displaced left glenoid fracture  MRI of the shoulder shows nondisplaced posterior labral tear with multilobulated paralabral cyst of the spinoglenoid notch. no acute osseous finding. Discussion of management or test interpretation with external provider(s):   Patient with nondisplaced posterior labral tear with  paralabral cyst.  Consulted orthopedics, Dr. Margrette.  He recommended sling and close outpatient follow-up with orthopedics.  This is a Teacher, Adult Education. injury.  Risk Prescription drug management.           Final Clinical Impression(s) / ED Diagnoses Final diagnoses:  Labial tear, initial encounter    Rx / DC Orders ED Discharge Orders     None         Herlinda Milling, PA-C 07/23/23 1320    Dean Clarity, MD 07/23/23 1427

## 2023-07-29 ENCOUNTER — Telehealth: Payer: Self-pay

## 2023-07-29 NOTE — Progress Notes (Signed)
Transition Care Management Follow-up Telephone Call Date of discharge and from where: 07/23/2023 [facility] How have you been since you were released from the hospital? Patient stated he is doing ok, his pain level is about the same. Any questions or concerns? No  Items Reviewed: Did the pt receive and understand the discharge instructions provided? Yes  Medications obtained and verified? Yes  Other? No  Any new allergies since your discharge? No  Dietary orders reviewed? Yes Do you have support at home? Yes   Follow up appointments reviewed:  PCP Hospital f/u appt confirmed? No  Scheduled to see  on  @ . Specialist Hospital f/u appt confirmed?  Patient stated he is awaiting a call from the surgeons office.  Scheduled to see on  @ . Are transportation arrangements needed? No  If their condition worsens, is the pt aware to call PCP or go to the Emergency Dept.? Yes Was the patient provided with contact information for the PCP's office or ED? Yes Was to pt encouraged to call back with questions or concerns? Yes   Valla Pacey [facility]  [facility] Guide Direct Dial: [phone]  Fax: [phone] Website: [email or web address]

## 2024-02-11 DIAGNOSIS — L739 Follicular disorder, unspecified: Secondary | ICD-10-CM | POA: Diagnosis not present

## 2024-02-11 DIAGNOSIS — Z202 Contact with and (suspected) exposure to infections with a predominantly sexual mode of transmission: Secondary | ICD-10-CM | POA: Diagnosis not present

## 2024-04-21 DIAGNOSIS — G44209 Tension-type headache, unspecified, not intractable: Secondary | ICD-10-CM | POA: Diagnosis not present

## 2024-04-21 DIAGNOSIS — E785 Hyperlipidemia, unspecified: Secondary | ICD-10-CM | POA: Diagnosis not present

## 2024-04-21 DIAGNOSIS — Z13220 Encounter for screening for lipoid disorders: Secondary | ICD-10-CM | POA: Diagnosis not present

## 2024-04-21 DIAGNOSIS — Z0000 Encounter for general adult medical examination without abnormal findings: Secondary | ICD-10-CM | POA: Diagnosis not present

## 2024-04-21 DIAGNOSIS — Z131 Encounter for screening for diabetes mellitus: Secondary | ICD-10-CM | POA: Diagnosis not present

## 2024-04-21 DIAGNOSIS — Z23 Encounter for immunization: Secondary | ICD-10-CM | POA: Diagnosis not present

## 2024-06-23 DIAGNOSIS — Z03818 Encounter for observation for suspected exposure to other biological agents ruled out: Secondary | ICD-10-CM | POA: Diagnosis not present

## 2024-06-23 DIAGNOSIS — R0602 Shortness of breath: Secondary | ICD-10-CM | POA: Diagnosis not present

## 2024-06-23 DIAGNOSIS — J189 Pneumonia, unspecified organism: Secondary | ICD-10-CM | POA: Diagnosis not present

## 2024-06-23 DIAGNOSIS — R509 Fever, unspecified: Secondary | ICD-10-CM | POA: Diagnosis not present
